# Patient Record
Sex: Female | Born: 1950 | Race: Black or African American | Hispanic: No | Smoking: Never smoker
Health system: Southern US, Community
[De-identification: ages and names within clinical notes are randomized; demographics above are authoritative.]

## PROBLEM LIST (undated history)

## (undated) DIAGNOSIS — M1990 Unspecified osteoarthritis, unspecified site: Secondary | ICD-10-CM

## (undated) DIAGNOSIS — R12 Heartburn: Secondary | ICD-10-CM

## (undated) DIAGNOSIS — K219 Gastro-esophageal reflux disease without esophagitis: Secondary | ICD-10-CM

## (undated) DIAGNOSIS — C55 Malignant neoplasm of uterus, part unspecified: Secondary | ICD-10-CM

## (undated) DIAGNOSIS — R918 Other nonspecific abnormal finding of lung field: Secondary | ICD-10-CM

## (undated) DIAGNOSIS — I10 Essential (primary) hypertension: Secondary | ICD-10-CM

## (undated) DIAGNOSIS — E785 Hyperlipidemia, unspecified: Secondary | ICD-10-CM

## (undated) DIAGNOSIS — E119 Type 2 diabetes mellitus without complications: Secondary | ICD-10-CM

## (undated) DIAGNOSIS — F419 Anxiety disorder, unspecified: Secondary | ICD-10-CM

## (undated) HISTORY — DX: Essential (primary) hypertension: I10

## (undated) HISTORY — PX: EYE SURGERY: SHX253

## (undated) HISTORY — DX: Heartburn: R12

## (undated) HISTORY — DX: Type 2 diabetes mellitus without complications: E11.9

## (undated) HISTORY — DX: Unspecified osteoarthritis, unspecified site: M19.90

## (undated) HISTORY — PX: ABDOMINAL HYSTERECTOMY: SUR658

## (undated) HISTORY — PX: ABDOMINAL HYSTERECTOMY: SHX81

## (undated) HISTORY — DX: Anxiety disorder, unspecified: F41.9

## (undated) HISTORY — DX: Malignant neoplasm of uterus, part unspecified: C55

## (undated) HISTORY — PX: DILATION AND CURETTAGE OF UTERUS: SHX78

## (undated) HISTORY — DX: Hyperlipidemia, unspecified: E78.5

---

## 2000-10-12 ENCOUNTER — Emergency Department (HOSPITAL_COMMUNITY): Admission: EM | Admit: 2000-10-12 | Discharge: 2000-10-13 | Payer: Self-pay | Admitting: Emergency Medicine

## 2006-05-16 ENCOUNTER — Emergency Department: Payer: Self-pay | Admitting: General Practice

## 2007-10-23 ENCOUNTER — Other Ambulatory Visit: Payer: Self-pay

## 2007-10-23 ENCOUNTER — Emergency Department: Payer: Self-pay | Admitting: Emergency Medicine

## 2009-08-04 ENCOUNTER — Emergency Department: Payer: Self-pay | Admitting: Emergency Medicine

## 2010-01-14 ENCOUNTER — Ambulatory Visit: Payer: Self-pay | Admitting: Family

## 2010-01-17 ENCOUNTER — Ambulatory Visit: Payer: Self-pay | Admitting: Family

## 2011-04-22 ENCOUNTER — Ambulatory Visit: Payer: Self-pay

## 2012-12-07 ENCOUNTER — Emergency Department: Payer: Self-pay | Admitting: Emergency Medicine

## 2012-12-07 LAB — URINALYSIS, COMPLETE
Bilirubin,UR: NEGATIVE
Glucose,UR: NEGATIVE mg/dL (ref 0–75)
Ketone: NEGATIVE
Nitrite: NEGATIVE
Ph: 6 (ref 4.5–8.0)
Protein: NEGATIVE
RBC,UR: 1 /HPF (ref 0–5)
Specific Gravity: 1.012 (ref 1.003–1.030)

## 2012-12-07 LAB — COMPREHENSIVE METABOLIC PANEL
Albumin: 3.7 g/dL (ref 3.4–5.0)
Calcium, Total: 8.7 mg/dL (ref 8.5–10.1)
Creatinine: 0.92 mg/dL (ref 0.60–1.30)
Glucose: 174 mg/dL — ABNORMAL HIGH (ref 65–99)
Osmolality: 281 (ref 275–301)
Potassium: 3.7 mmol/L (ref 3.5–5.1)
SGOT(AST): 21 U/L (ref 15–37)
Sodium: 138 mmol/L (ref 136–145)
Total Protein: 8 g/dL (ref 6.4–8.2)

## 2012-12-07 LAB — CBC WITH DIFFERENTIAL/PLATELET
Basophil %: 1.5 %
Eosinophil #: 0.2 10*3/uL (ref 0.0–0.7)
Eosinophil %: 2.9 %
HCT: 38.3 % (ref 35.0–47.0)
Lymphocyte #: 1.8 10*3/uL (ref 1.0–3.6)
MCH: 30.9 pg (ref 26.0–34.0)
Monocyte #: 0.3 x10 3/mm (ref 0.2–0.9)
Monocyte %: 5.7 %
Neutrophil #: 3 10*3/uL (ref 1.4–6.5)
Neutrophil %: 56.2 %
Platelet: 222 10*3/uL (ref 150–440)
RBC: 4.29 10*6/uL (ref 3.80–5.20)
RDW: 13.2 % (ref 11.5–14.5)

## 2012-12-07 LAB — TROPONIN I: Troponin-I: <0.02 ng/mL

## 2014-10-11 ENCOUNTER — Other Ambulatory Visit: Payer: Self-pay | Admitting: Family Medicine

## 2014-10-11 DIAGNOSIS — Z1231 Encounter for screening mammogram for malignant neoplasm of breast: Secondary | ICD-10-CM

## 2014-10-19 ENCOUNTER — Ambulatory Visit
Admission: RE | Admit: 2014-10-19 | Discharge: 2014-10-19 | Disposition: A | Discharge status: Home / Self Care | Payer: Medicare HMO | Source: Ambulatory Visit | Attending: Family Medicine | Admitting: Family Medicine

## 2014-10-19 ENCOUNTER — Other Ambulatory Visit: Payer: Self-pay | Admitting: Family Medicine

## 2014-10-19 DIAGNOSIS — Z1231 Encounter for screening mammogram for malignant neoplasm of breast: Secondary | ICD-10-CM | POA: Diagnosis present

## 2014-12-14 ENCOUNTER — Other Ambulatory Visit: Payer: Self-pay | Admitting: Family Medicine

## 2014-12-14 DIAGNOSIS — Z780 Asymptomatic menopausal state: Secondary | ICD-10-CM

## 2014-12-25 ENCOUNTER — Ambulatory Visit: Payer: Medicare HMO | Attending: Family Medicine

## 2016-05-18 ENCOUNTER — Other Ambulatory Visit
Admission: RE | Admit: 2016-05-18 | Discharge: 2016-05-18 | Disposition: A | Discharge status: Home / Self Care | Payer: Medicare HMO | Source: Other Acute Inpatient Hospital | Attending: Ophthalmology | Admitting: Ophthalmology

## 2016-05-18 DIAGNOSIS — H04302 Unspecified dacryocystitis of left lacrimal passage: Secondary | ICD-10-CM | POA: Diagnosis present

## 2016-05-22 LAB — EYE CULTURE

## 2016-08-26 ENCOUNTER — Other Ambulatory Visit: Payer: Self-pay | Admitting: Family Medicine

## 2016-08-26 DIAGNOSIS — Z1239 Encounter for other screening for malignant neoplasm of breast: Secondary | ICD-10-CM

## 2016-08-26 DIAGNOSIS — Z780 Asymptomatic menopausal state: Secondary | ICD-10-CM

## 2016-09-30 ENCOUNTER — Other Ambulatory Visit: Payer: Medicare HMO

## 2016-09-30 ENCOUNTER — Ambulatory Visit: Payer: Medicare HMO | Attending: Family Medicine

## 2016-10-19 ENCOUNTER — Encounter: Payer: Self-pay | Admitting: Urology

## 2016-10-19 ENCOUNTER — Ambulatory Visit (INDEPENDENT_AMBULATORY_CARE_PROVIDER_SITE_OTHER): Payer: Medicare Other | Admitting: Urology

## 2016-10-19 VITALS — BP 148/89 | HR 64 | Ht 66.0 in | Wt 169.7 lb

## 2016-10-19 DIAGNOSIS — N3010 Interstitial cystitis (chronic) without hematuria: Secondary | ICD-10-CM | POA: Diagnosis not present

## 2016-10-19 DIAGNOSIS — N3020 Other chronic cystitis without hematuria: Secondary | ICD-10-CM | POA: Diagnosis not present

## 2016-10-19 DIAGNOSIS — R109 Unspecified abdominal pain: Secondary | ICD-10-CM | POA: Diagnosis not present

## 2016-10-19 LAB — URINALYSIS, COMPLETE
Bilirubin, UA: NEGATIVE
GLUCOSE, UA: NEGATIVE
KETONES UA: NEGATIVE
Nitrite, UA: NEGATIVE
PROTEIN UA: NEGATIVE
RBC, UA: NEGATIVE
SPEC GRAV UA: 1.015 (ref 1.005–1.030)
Urobilinogen, Ur: 0.2 mg/dL (ref 0.2–1.0)
pH, UA: 6 (ref 5.0–7.5)

## 2016-10-19 LAB — MICROSCOPIC EXAMINATION: Bacteria, UA: NONE SEEN

## 2016-10-19 NOTE — Progress Notes (Signed)
10/19/2016 10:42 AM   Diane Graves 24-Aug-1950 [id number]  Referring provider: [facility], [address] [place], [place] [address]  Chief Complaint  Patient presents with  . New Patient (Initial Visit)    IC  referred by Princella Ion Dr. Clide Deutscher    HPI: I was consulted to assess the patient's suprapubic pain that is been present for a few months. Some of the details of the history were a little bit difficult to ascertain but it appears that it is an almost daily problem. It is not as severe as it was. She was also having some nonspecific left flank discomfort at one point in time. The pain would be relieved by voiding.  She can void every 30 minutes and sometimes every 2 hours. She gets up 3 or 4 times at night.  She just started on oral hypoglycemics. Sometimes she loses bowel control with loose bowel movements and now she is constipated. She has had a hysterectomy. She has been on antibiotics for various causes over the last year and only occasionally gets a bladder infection  Modifying factors: There are no other modifying factors  Associated signs and symptoms: There are no other associated signs and symptoms Aggravating and relieving factors: There are no other aggravating or relieving factors Severity: Moderate Duration: Persistent     PMH: Past Medical History:  Diagnosis Date  . Anxiety   . Arthritis   . Diabetes ([place])   . Heartburn   . HLD (hyperlipidemia)   . HTN (hypertension)   . Uterine cancer [facility])     Surgical History: Past Surgical History:  Procedure Laterality Date  . ABDOMINAL HYSTERECTOMY    . DILATION AND CURETTAGE OF UTERUS      Home Medications:  Allergies as of 10/19/2016      Reactions   Ciprofloxacin Shortness Of Breath      Medication List       Accurate as of 10/19/16 10:42 AM. Always use your most recent med list.          amLODipine-benazepril 5-40 MG capsule Commonly known as:   LOTREL Take by mouth.   aspirin 81 MG chewable tablet Chew by mouth.   Carboxymethylcellulose Sod PF 1 % Gel 1 drop Three (3) times a day in apheresis as needed.   cetirizine 10 MG tablet Commonly known as:  ZYRTEC Take 10 mg by mouth.   ciprofloxacin 0.3 % ophthalmic solution Commonly known as:  CILOXAN Administer 1 drop into the left eye Every six (6) hours. Administer 1 drop, every 2 hours, while awake, for 2 days. Then 1 drop, every 4 hours, while awake, for the next 5 days.   fluticasone 50 MCG/ACT nasal spray Commonly known as:  FLONASE 1 spray by Each Nare route daily as needed.   glucosamine-chondroitin 500-400 MG tablet Take 1 tablet by mouth 3 (three) times daily.   metFORMIN 500 MG 24 hr tablet Commonly known as:  GLUCOPHAGE-XR Take 500 mg by mouth daily with breakfast.   multivitamin capsule Take by mouth.   omeprazole 20 MG capsule Commonly known as:  PRILOSEC Take by mouth.   oxyCODONE-acetaminophen 5-325 MG tablet Commonly known as:  PERCOCET/ROXICET Take 1 tablet every 6 hours as needed for pain not responsive to Tylenol. Do not take if nauseated.   pravastatin 40 MG tablet Commonly known as:  PRAVACHOL Take by mouth.   ranitidine 150 MG capsule Commonly known as:  ZANTAC Take 150 mg by mouth.  SINUS RINSE KIT Pack Place into the nose.   Vitamin D3 2000 units capsule Take by mouth.       Allergies:  Allergies  Allergen Reactions  . Ciprofloxacin Shortness Of Breath    Family History: Family History  Problem Relation Age of Onset  . Breast cancer Sister 34  . Kidney cancer Neg Hx   . Bladder Cancer Neg Hx     Social History:  reports that she has never smoked. She has never used smokeless tobacco. She reports that she does not drink alcohol or use drugs.  ROS: UROLOGY Frequent Urination?: Yes Hard to postpone urination?: No Burning/pain with urination?: No Get up at night to urinate?: Yes Leakage of urine?: No Urine stream  starts and stops?: No Trouble starting stream?: No Do you have to strain to urinate?: No Blood in urine?: No Urinary tract infection?: Yes Sexually transmitted disease?: No Injury to kidneys or bladder?: No Painful intercourse?: Yes Weak stream?: No Currently pregnant?: No Vaginal bleeding?: No Last menstrual period?: n  Gastrointestinal Nausea?: No Vomiting?: No Indigestion/heartburn?: Yes Diarrhea?: Yes Constipation?: Yes  Constitutional Fever: No Night sweats?: No Weight loss?: No Fatigue?: Yes  Skin Skin rash/lesions?: No Itching?: No  Eyes Blurred vision?: Yes Double vision?: No  Ears/Nose/Throat Sore throat?: No Sinus problems?: No  Hematologic/Lymphatic Swollen glands?: No Easy bruising?: No  Cardiovascular Leg swelling?: No Chest pain?: No  Respiratory Cough?: No Shortness of breath?: No  Endocrine Excessive thirst?: No  Musculoskeletal Back pain?: Yes Joint pain?: Yes  Neurological Headaches?: Yes Dizziness?: No  Psychologic Depression?: No Anxiety?: No  Physical Exam: BP (!) 148/89   Pulse 64   Ht 5' 6"  (1.676 m)   Wt 169 lb 11.2 oz (77 kg)   BMI 27.39 kg/m   Constitutional:  Alert and oriented, No acute distress. HEENT: [place] AT, moist mucus membranes.  Trachea midline, no masses. Cardiovascular: No clubbing, cyanosis, or edema. Respiratory: Normal respiratory effort, no increased work of breathing. GI: Abdomen is soft, nontender, nondistended, no abdominal masses GU: Well supported bladder neck; no prolapse or bladder tenderness Skin: No rashes, bruises or suspicious lesions. Lymph: No cervical or inguinal adenopathy. Neurologic: Grossly intact, no focal deficits, moving all 4 extremities. Psychiatric: Normal mood and affect.  Laboratory Data: Lab Results  Component Value Date   WBC 5.3 12/07/2012   HGB 13.2 12/07/2012   HCT 38.3 12/07/2012   MCV 89 12/07/2012   PLT 222 12/07/2012    Lab Results  Component Value  Date   CREATININE 0.92 12/07/2012    No results found for: PSA  No results found for: TESTOSTERONE  No results found for: HGBA1C  Urinalysis    Component Value Date/Time   COLORURINE Straw 12/07/2012 1543   APPEARANCEUR Clear 12/07/2012 1543   LABSPEC 1.012 12/07/2012 1543   PHURINE 6.0 12/07/2012 1543   GLUCOSEU Negative 12/07/2012 1543   HGBUR Negative 12/07/2012 1543   BILIRUBINUR Negative 12/07/2012 1543   KETONESUR Negative 12/07/2012 1543   PROTEINUR Negative 12/07/2012 1543   NITRITE Negative 12/07/2012 1543   LEUKOCYTESUR 1+ 12/07/2012 1543    Pertinent Imaging: None  Assessment & Plan:  The patient has nonspecific suprapubic pain and intermittent left flank discomfort. The urine did not look infected today but I sent it for culture. There is a question that she might have interstitial cystitis. Her frequency is varying and she has moderate nocturia. The role of a cystoscopy and urodynamics was discussed. She may need a hydrodistention in the  future. She had no blood in the urine today  The patient will call us in about a month when she has her insurance shorted out from [place]. She used to live in [place]. We will then order a CT scan of the abdomin and pelvis with and without contrast to assess left flank pain and suprapubic pain and she will return for cystoscopy. I did not discuss the potential of interstitial cystitis today. She did report the bladder irritants can make it worse  1. IC (interstitial cystitis) 2. Chronic cystitis 3. Abdominal pain 4. Urinary frequency - Urinalysis, Complete   No Follow-up on file.  Reece Packer, MD  [facility] [address] [place], [place] [address] [phone]

## 2016-10-21 LAB — CULTURE, URINE COMPREHENSIVE

## 2017-05-03 ENCOUNTER — Other Ambulatory Visit: Payer: Self-pay | Admitting: Internal Medicine

## 2020-02-13 ENCOUNTER — Emergency Department (HOSPITAL_COMMUNITY): Payer: Medicare Other

## 2020-02-13 ENCOUNTER — Other Ambulatory Visit: Payer: Self-pay

## 2020-02-13 ENCOUNTER — Encounter (HOSPITAL_COMMUNITY): Payer: Self-pay

## 2020-02-13 ENCOUNTER — Emergency Department (HOSPITAL_COMMUNITY)
Admission: EM | Admit: 2020-02-13 | Discharge: 2020-02-13 | Disposition: A | Discharge status: Home / Self Care | Payer: Medicare Other | Attending: Emergency Medicine | Admitting: Emergency Medicine

## 2020-02-13 DIAGNOSIS — Z7982 Long term (current) use of aspirin: Secondary | ICD-10-CM | POA: Diagnosis not present

## 2020-02-13 DIAGNOSIS — Z79899 Other long term (current) drug therapy: Secondary | ICD-10-CM | POA: Insufficient documentation

## 2020-02-13 DIAGNOSIS — Y93I9 Activity, other involving external motion: Secondary | ICD-10-CM | POA: Diagnosis not present

## 2020-02-13 DIAGNOSIS — R519 Headache, unspecified: Secondary | ICD-10-CM | POA: Insufficient documentation

## 2020-02-13 DIAGNOSIS — Z7984 Long term (current) use of oral hypoglycemic drugs: Secondary | ICD-10-CM | POA: Insufficient documentation

## 2020-02-13 DIAGNOSIS — Z8541 Personal history of malignant neoplasm of cervix uteri: Secondary | ICD-10-CM | POA: Diagnosis not present

## 2020-02-13 DIAGNOSIS — I10 Essential (primary) hypertension: Secondary | ICD-10-CM | POA: Insufficient documentation

## 2020-02-13 DIAGNOSIS — E119 Type 2 diabetes mellitus without complications: Secondary | ICD-10-CM | POA: Insufficient documentation

## 2020-02-13 DIAGNOSIS — Y92410 Unspecified street and highway as the place of occurrence of the external cause: Secondary | ICD-10-CM | POA: Insufficient documentation

## 2020-02-13 DIAGNOSIS — M25512 Pain in left shoulder: Secondary | ICD-10-CM
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
## 2020-02-13 NOTE — Discharge Instructions (Addendum)
You can take Tylenol or Ibuprofen as directed for pain. You can alternate Tylenol and Ibuprofen every 4 hours. If you take Tylenol at 1pm, then you can take Ibuprofen at 5pm. Then you can take Tylenol again at 9pm.   Follow-up with your primary care doctor.  Return emergency department for any worsening pain, chest pain, difficulty breathing, abdominal pain, vomiting, difficulty walking or any other worsening concerning symptoms.
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
## 2021-07-22 ENCOUNTER — Other Ambulatory Visit (HOSPITAL_COMMUNITY): Payer: Self-pay | Admitting: Internal Medicine

## 2021-07-22 DIAGNOSIS — Z1231 Encounter for screening mammogram for malignant neoplasm of breast: Secondary | ICD-10-CM

## 2021-08-04 ENCOUNTER — Ambulatory Visit (HOSPITAL_COMMUNITY)
Admission: RE | Admit: 2021-08-04 | Discharge: 2021-08-04 | Disposition: A | Discharge status: Home / Self Care | Payer: Medicare Other | Source: Ambulatory Visit | Attending: Internal Medicine | Admitting: Internal Medicine

## 2021-08-04 ENCOUNTER — Other Ambulatory Visit: Payer: Self-pay

## 2021-08-04 DIAGNOSIS — Z1231 Encounter for screening mammogram for malignant neoplasm of breast: Secondary | ICD-10-CM | POA: Diagnosis not present

## 2021-09-21 DIAGNOSIS — I10 Essential (primary) hypertension: Secondary | ICD-10-CM | POA: Diagnosis not present

## 2021-09-21 DIAGNOSIS — E1169 Type 2 diabetes mellitus with other specified complication: Secondary | ICD-10-CM | POA: Diagnosis not present

## 2021-10-22 DIAGNOSIS — E1169 Type 2 diabetes mellitus with other specified complication: Secondary | ICD-10-CM | POA: Diagnosis not present

## 2021-10-22 DIAGNOSIS — I10 Essential (primary) hypertension: Secondary | ICD-10-CM | POA: Diagnosis not present

## 2021-11-21 DIAGNOSIS — E1169 Type 2 diabetes mellitus with other specified complication: Secondary | ICD-10-CM | POA: Diagnosis not present

## 2021-11-21 DIAGNOSIS — I10 Essential (primary) hypertension: Secondary | ICD-10-CM | POA: Diagnosis not present

## 2021-12-25 DIAGNOSIS — E1165 Type 2 diabetes mellitus with hyperglycemia: Secondary | ICD-10-CM | POA: Diagnosis not present

## 2021-12-25 DIAGNOSIS — I10 Essential (primary) hypertension: Secondary | ICD-10-CM | POA: Diagnosis not present

## 2022-01-19 DIAGNOSIS — I10 Essential (primary) hypertension: Secondary | ICD-10-CM | POA: Diagnosis not present

## 2022-01-19 DIAGNOSIS — K219 Gastro-esophageal reflux disease without esophagitis: Secondary | ICD-10-CM | POA: Diagnosis not present

## 2022-01-19 DIAGNOSIS — E1169 Type 2 diabetes mellitus with other specified complication: Secondary | ICD-10-CM | POA: Diagnosis not present

## 2022-01-19 DIAGNOSIS — Z23 Encounter for immunization: Secondary | ICD-10-CM | POA: Diagnosis not present

## 2022-02-18 DIAGNOSIS — I10 Essential (primary) hypertension: Secondary | ICD-10-CM | POA: Diagnosis not present

## 2022-02-18 DIAGNOSIS — E1169 Type 2 diabetes mellitus with other specified complication: Secondary | ICD-10-CM | POA: Diagnosis not present

## 2022-03-21 DIAGNOSIS — I10 Essential (primary) hypertension: Secondary | ICD-10-CM | POA: Diagnosis not present

## 2022-03-21 DIAGNOSIS — E1169 Type 2 diabetes mellitus with other specified complication: Secondary | ICD-10-CM | POA: Diagnosis not present

## 2022-03-26 DIAGNOSIS — I10 Essential (primary) hypertension: Secondary | ICD-10-CM | POA: Diagnosis not present

## 2022-03-26 DIAGNOSIS — E119 Type 2 diabetes mellitus without complications: Secondary | ICD-10-CM | POA: Diagnosis not present

## 2022-03-26 DIAGNOSIS — N390 Urinary tract infection, site not specified: Secondary | ICD-10-CM | POA: Diagnosis not present

## 2022-04-26 DIAGNOSIS — I10 Essential (primary) hypertension: Secondary | ICD-10-CM | POA: Diagnosis not present

## 2022-04-26 DIAGNOSIS — E119 Type 2 diabetes mellitus without complications: Secondary | ICD-10-CM | POA: Diagnosis not present

## 2022-05-27 DIAGNOSIS — E119 Type 2 diabetes mellitus without complications: Secondary | ICD-10-CM | POA: Diagnosis not present

## 2022-05-27 DIAGNOSIS — I10 Essential (primary) hypertension: Secondary | ICD-10-CM | POA: Diagnosis not present

## 2022-08-31 NOTE — H&P (Signed)
Surgical History & Physical  Patient Name: Diane Graves  DOB: 07/22/1950  Surgery: Cataract extraction with intraocular lens implant phacoemulsification; Right Eye Surgeon: Pecolia Ades MD Surgery Date: 09/18/2022 Pre-Op Date: 08/11/2022  HPI: A 17 Yr. old female patient 1. The patient complains of difficulty when viewing TV, glare problems when driving during the day, difficulties reading street signs, which began a few months ago. Both eyes are affected. The episode is constant. The condition's severity is worsening. This is negatively affecting the patient's quality of life and the patient is unable to function adequately in life with the current level of vision. Patient states she stopped Timolol and started a new drop that she uses at night OU (unsure name).  Medical History: Dry Eyes Glaucoma Cataracts Diabetes High Blood Pressure  Review of Systems Cardiovascular High Blood Pressure Endocrine diabetic All recorded systems are negative except as noted above.  Social Never smoked   Medication Metformin, Amlodipine besylate, Lotensin, Allergy pills, Pravastatin  Sx/Procedures Partial Hysterectomy  Drug Allergies  Sulfa, Amitriptyline  History & Physical: Heent: cataracts  NECK: supple without bruits LUNGS: lungs clear to auscultation CV: regular rate and rhythm Abdomen: soft and non-tender  Impression & Plan: Assessment: 1.  CATARACT AGE-RELATED NUCLEAR; Both Eyes (H25.13) 2.  CONJUNCTIVITIS ALLERGIC CHRONIC OTHER ; Both Eyes (H10.45) 3.  GLAUCOMA SUSPECT; Both Eyes (H40.003) 4.  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  Hyperopia ; Both Eyes (H52.03)  Plan: 1.  Cataracts are visually significant, and narrowing the anatomic angle, and account for the patient's complaints. Discussed all risks, benefits, procedures and recovery, including infection, loss of vision and eye, need for glasses after surgery or additional procedures. Patient  understands changing glasses will not improve vision. Patient indicated understanding of procedure. All questions answered. Patient desires to have surgery, recommend phacoemulsification with intraocular lens. Patient to have preliminary testing necessary (Argos/IOL Master, Mac OCT, TOPO) Educational materials provided:Cataract. RECOMMEND STANDARD VS EYEHANCE VS TORIC VS VIVTY  Plan: - Proceed with cataract surgery OD, followed by OS - Target best distance, RayOne lens - Narrow angles - ok with lying flat, no DM, no fuchs  2.  Discussed allergy treatment in detail. Block allergic response: Topical antihistamine for allergic conjunctivitis, oral antihistamine.  Improved with Pataday. Will continue this for tearing/itching  3.  Possible CACG with narrow angles. Will complete work up at next visit. IOP good today on timolol qam. OCT RNFL shows thinning OS>OD Pachy thin OU, 493/493 Gonio narrow with one quadrant to PTM. HVF looks great today.  Proceed with cataract extraction as above  4.  S/p nasolacrimal opening procedure. Unsure if this was DCR? Performed at [facility] May consider referral for repeat surgery in the future  5.  Hold Mrx for now

## 2022-09-11 ENCOUNTER — Encounter (HOSPITAL_COMMUNITY)
Admission: RE | Admit: 2022-09-11 | Discharge: 2022-09-11 | Disposition: A | Discharge status: Home / Self Care | Payer: Medicare Other | Source: Ambulatory Visit | Attending: Optometry | Admitting: Optometry

## 2022-09-11 ENCOUNTER — Encounter (HOSPITAL_COMMUNITY): Payer: Self-pay

## 2022-09-11 HISTORY — DX: Other nonspecific abnormal finding of lung field: R91.8

## 2022-09-11 HISTORY — DX: Gastro-esophageal reflux disease without esophagitis: K21.9

## 2022-09-18 ENCOUNTER — Encounter (HOSPITAL_COMMUNITY): Payer: Self-pay | Admitting: Optometry

## 2022-09-18 ENCOUNTER — Ambulatory Visit (HOSPITAL_COMMUNITY)
Admission: RE | Admit: 2022-09-18 | Discharge: 2022-09-18 | Disposition: A | Discharge status: Home / Self Care | Payer: Medicare Other | Source: Ambulatory Visit | Attending: Optometry | Admitting: Optometry

## 2022-09-18 ENCOUNTER — Encounter (HOSPITAL_COMMUNITY)
Admission: RE | Disposition: A | Discharge status: Home / Self Care | Payer: Self-pay | Source: Ambulatory Visit | Attending: Optometry

## 2022-09-18 ENCOUNTER — Ambulatory Visit (HOSPITAL_COMMUNITY): Payer: Medicare Other | Admitting: Anesthesiology

## 2022-09-18 ENCOUNTER — Ambulatory Visit (HOSPITAL_BASED_OUTPATIENT_CLINIC_OR_DEPARTMENT_OTHER): Payer: Medicare Other | Admitting: Anesthesiology

## 2022-09-18 DIAGNOSIS — K219 Gastro-esophageal reflux disease without esophagitis: Secondary | ICD-10-CM | POA: Diagnosis not present

## 2022-09-18 DIAGNOSIS — I10 Essential (primary) hypertension: Secondary | ICD-10-CM | POA: Diagnosis not present

## 2022-09-18 DIAGNOSIS — F419 Anxiety disorder, unspecified: Secondary | ICD-10-CM | POA: Insufficient documentation

## 2022-09-18 DIAGNOSIS — E1136 Type 2 diabetes mellitus with diabetic cataract: Secondary | ICD-10-CM | POA: Diagnosis not present

## 2022-09-18 DIAGNOSIS — H2511 Age-related nuclear cataract, right eye: Secondary | ICD-10-CM | POA: Diagnosis not present

## 2022-09-18 DIAGNOSIS — E119 Type 2 diabetes mellitus without complications: Secondary | ICD-10-CM

## 2022-09-18 HISTORY — PX: CATARACT EXTRACTION W/PHACO: SHX586

## 2022-09-18 LAB — GLUCOSE, CAPILLARY: Glucose-Capillary: 113 mg/dL — ABNORMAL HIGH (ref 70–99)

## 2022-09-18 SURGERY — PHACOEMULSIFICATION, CATARACT, WITH IOL INSERTION
Anesthesia: Monitor Anesthesia Care | Site: Eye | Laterality: Right

## 2022-09-18 MED ORDER — TROPICAMIDE 1 % OP SOLN
1.0000 [drp] | OPHTHALMIC | Status: AC
  Administered 2022-09-18 (×3): 1 [drp] via OPHTHALMIC

## 2022-09-18 MED ORDER — MIDAZOLAM HCL 2 MG/2ML IJ SOLN
INTRAMUSCULAR | Status: AC
  Filled 2022-09-18: qty 2

## 2022-09-18 MED ORDER — SIGHTPATH DOSE#1 NA HYALUR & NA CHOND-NA HYALUR IO KIT
PACK | INTRAOCULAR | Status: DC | PRN
  Administered 2022-09-18: 1 via OPHTHALMIC

## 2022-09-18 MED ORDER — POVIDONE-IODINE 5 % OP SOLN
OPHTHALMIC | Status: DC | PRN
  Administered 2022-09-18: 1 via OPHTHALMIC

## 2022-09-18 MED ORDER — LIDOCAINE HCL 3.5 % OP GEL
1.0000 | Freq: Once | OPHTHALMIC | Status: AC
  Administered 2022-09-18: 1 via OPHTHALMIC

## 2022-09-18 MED ORDER — TETRACAINE HCL 0.5 % OP SOLN
1.0000 [drp] | OPHTHALMIC | Status: AC
  Administered 2022-09-18 (×3): 1 [drp] via OPHTHALMIC

## 2022-09-18 MED ORDER — STERILE WATER FOR IRRIGATION IR SOLN
Status: DC | PRN
  Administered 2022-09-18: 250 mL

## 2022-09-18 MED ORDER — MIDAZOLAM HCL 2 MG/2ML IJ SOLN
INTRAMUSCULAR | Status: DC | PRN
  Administered 2022-09-18: 1 mg via INTRAVENOUS

## 2022-09-18 MED ORDER — NEOMYCIN-POLYMYXIN-DEXAMETH 3.5-10000-0.1 OP SUSP
OPHTHALMIC | Status: DC | PRN
  Administered 2022-09-18: 2 [drp] via OPHTHALMIC

## 2022-09-18 MED ORDER — PHENYLEPHRINE-KETOROLAC 1-0.3 % IO SOLN
INTRAOCULAR | Status: DC | PRN
  Administered 2022-09-18: 500 mL via OPHTHALMIC

## 2022-09-18 MED ORDER — LIDOCAINE HCL (PF) 1 % IJ SOLN
INTRAMUSCULAR | Status: DC | PRN
  Administered 2022-09-18: 2 mL

## 2022-09-18 MED ORDER — FENTANYL CITRATE (PF) 100 MCG/2ML IJ SOLN
INTRAMUSCULAR | Status: DC | PRN
  Administered 2022-09-18: 50 ug via INTRAVENOUS

## 2022-09-18 MED ORDER — PHENYLEPHRINE HCL 2.5 % OP SOLN
1.0000 [drp] | OPHTHALMIC | Status: AC
  Administered 2022-09-18 (×3): 1 [drp] via OPHTHALMIC

## 2022-09-18 MED ORDER — BSS IO SOLN
INTRAOCULAR | Status: DC | PRN
  Administered 2022-09-18: 15 mL via INTRAOCULAR

## 2022-09-18 MED ORDER — FENTANYL CITRATE (PF) 100 MCG/2ML IJ SOLN
INTRAMUSCULAR | Status: AC
  Filled 2022-09-18: qty 2

## 2022-09-18 SURGICAL SUPPLY — 15 items
CATARACT SUITE SIGHTPATH (MISCELLANEOUS) ×1 IMPLANT
CLOTH BEACON ORANGE TIMEOUT ST (SAFETY) ×2 IMPLANT
DRSG TEGADERM 4X4.75 (GAUZE/BANDAGES/DRESSINGS) ×2 IMPLANT
EYE SHIELD UNIVERSAL CLEAR (GAUZE/BANDAGES/DRESSINGS) IMPLANT
FEE CATARACT SUITE SIGHTPATH (MISCELLANEOUS) ×2 IMPLANT
GLOVE BIOGEL PI IND STRL 7.0 (GLOVE) ×4 IMPLANT
LENS IOL RAYNER 25.0 (Intraocular Lens) ×1 IMPLANT
LENS IOL RAYONE EMV 25.0 (Intraocular Lens) IMPLANT
NDL HYPO 18GX1.5 BLUNT FILL (NEEDLE) ×2 IMPLANT
NEEDLE HYPO 18GX1.5 BLUNT FILL (NEEDLE) ×1 IMPLANT
PAD ARMBOARD 7.5X6 YLW CONV (MISCELLANEOUS) ×2 IMPLANT
RING MALYGIN 7.0 (MISCELLANEOUS) IMPLANT
SYR TB 1ML LL NO SAFETY (SYRINGE) ×2 IMPLANT
TAPE SURG TRANSPORE 1 IN (GAUZE/BANDAGES/DRESSINGS) IMPLANT
WATER STERILE IRR 250ML POUR (IV SOLUTION) ×2 IMPLANT

## 2022-09-18 NOTE — Op Note (Signed)
Date of procedure: 09/18/22  Pre-operative diagnosis: Visually significant age-related nuclear cataract, Right Eye (H25.11)  Post-operative diagnosis: Visually significant age-related nuclear cataract, Right Eye  Procedure: Removal of cataract via phacoemulsification and insertion of intra-ocular lens Rayner [id number] +25.0D into the capsular bag of the Right Eye  Attending surgeon: Pecolia Ades, MD  Anesthesia: MAC, Topical Akten  Complications: None  Estimated Blood Loss: <54mL (minimal)  Specimens: None  Implants:  Implant Name Type Inv. Item Serial No. Manufacturer Lot No. LRB No. Used Action  LENS IOL RAYNER 25.0 - S37 Intraocular Lens LENS IOL RAYNER 25.0 37 SIGHTPATH [id number] Right 1 Implanted    Indications:  Visually significant age-related cataract, Right Eye  Procedure:  The patient was seen and identified in the pre-operative area. The operative eye was identified and dilated.  The operative eye was marked.  Topical anesthesia was administered to the operative eye.     The patient was then to the operative suite and placed in the supine position.  A timeout was performed confirming the patient, procedure to be performed, and all other relevant information.   The patient's face was prepped and draped in the usual fashion for intra-ocular surgery.  A lid speculum was placed into the operative eye and the surgical microscope moved into place and focused.  A superotemporal paracentesis was created using a 20 gauge paracentesis blade.  BSS mixed with Omidria, followed by 1% lidocaine was injected into the anterior chamber.  Viscoelastic was injected into the anterior chamber.  A temporal clear-corneal main wound incision was created using a 2.84mm microkeratome.  A continuous curvilinear capsulorrhexis was initiated using an irrigating cystitome and completed using capsulorrhexis forceps.  Hydrodissection and hydrodeliniation were performed.  Viscoelastic was injected into the  anterior chamber.  A phacoemulsification handpiece and a chopper as a second instrument were used to remove the nucleus and epinucleus. The irrigation/aspiration handpiece was used to remove any remaining cortical material.   The capsular bag was reinflated with viscoelastic, checked, and found to be intact.  The intraocular lens was inserted into the capsular bag.  The irrigation/aspiration handpiece was used to remove any remaining viscoelastic.  The clear corneal wound and paracentesis wounds were then hydrated and checked with Weck-Cels to be watertight.  The lid-speculum and drape was removed, and the patient's face was cleaned with a wet and dry 4x4.  Maxitrol drops were instilled onto the eye. A clear shield was taped over the eye. The patient was taken to the post-operative care unit in good condition, having tolerated the procedure well.  Post-Op Instructions: The patient will follow up at [facility] for a same day post-operative evaluation and will receive all other orders and instructions.

## 2022-09-18 NOTE — Interval H&P Note (Signed)
History and Physical Interval Note:  09/18/2022 7:58 AM  The H and P was reviewed and updated. The patient was examined.  No changes were found after exam.  The surgical eye was marked.  Diane Graves

## 2022-09-18 NOTE — Discharge Instructions (Signed)
Please discharge patient when stable, will follow up today with Dr. Obrian Bulson at the [facility] office immediately following discharge.  Leave shield in place until visit.  All paperwork with discharge instructions will be given at the office.  [facility] Address:  [address]  [place], [place] [address]  Dr. Janeliz Prestwood's Phone: [phone]  

## 2022-09-18 NOTE — Transfer of Care (Signed)
Immediate Anesthesia Transfer of Care Note  Patient: Diane Graves  Procedure(s) Performed: CATARACT EXTRACTION PHACO AND INTRAOCULAR LENS PLACEMENT (IOC) (Right: Eye)  Patient Location: PACU and Short Stay  Anesthesia Type:MAC  Level of Consciousness: awake and alert   Airway & Oxygen Therapy: Patient Spontanous Breathing and Patient connected to nasal cannula oxygen  Post-op Assessment: Report given to RN and Post -op Vital signs reviewed and stable  Post vital signs: Reviewed and stable  Last Vitals:  Vitals Value Taken Time  BP    Temp    Pulse    Resp    SpO2      Last Pain:  Vitals:   09/18/22 0737  TempSrc: Oral  PainSc: 0-No pain         Complications: No notable events documented.

## 2022-09-18 NOTE — Anesthesia Preprocedure Evaluation (Signed)
Anesthesia Evaluation  Patient identified by MRN, date of birth, ID band Patient awake    Reviewed: Allergy & Precautions, H&P , NPO status , Patient's Chart, lab work & pertinent test results, reviewed documented beta blocker date and time   Airway Mallampati: II  TM Distance: >3 FB Neck ROM: full    Dental no notable dental hx.    Pulmonary neg pulmonary ROS   Pulmonary exam normal breath sounds clear to auscultation       Cardiovascular Exercise Tolerance: Good hypertension, negative cardio ROS  Rhythm:regular Rate:Normal     Neuro/Psych   Anxiety     negative neurological ROS  negative psych ROS   GI/Hepatic negative GI ROS, Neg liver ROS,GERD  ,,  Endo/Other  negative endocrine ROSdiabetes    Renal/GU negative Renal ROS  negative genitourinary   Musculoskeletal   Abdominal   Peds  Hematology negative hematology ROS (+)   Anesthesia Other Findings   Reproductive/Obstetrics negative OB ROS                             Anesthesia Physical Anesthesia Plan  ASA: 2  Anesthesia Plan: MAC   Post-op Pain Management:    Induction:   PONV Risk Score and Plan: Propofol infusion  Airway Management Planned:   Additional Equipment:   Intra-op Plan:   Post-operative Plan:   Informed Consent: I have reviewed the patients History and Physical, chart, labs and discussed the procedure including the risks, benefits and alternatives for the proposed anesthesia with the patient or authorized representative who has indicated his/her understanding and acceptance.     Dental Advisory Given  Plan Discussed with: CRNA  Anesthesia Plan Comments:        Anesthesia Quick Evaluation

## 2022-09-19 NOTE — Anesthesia Postprocedure Evaluation (Signed)
Anesthesia Post Note  Patient: MATIE MERCURE  Procedure(s) Performed: CATARACT EXTRACTION PHACO AND INTRAOCULAR LENS PLACEMENT (IOC) (Right: Eye)  Patient location during evaluation: Phase II Anesthesia Type: MAC Level of consciousness: awake Pain management: pain level controlled Vital Signs Assessment: post-procedure vital signs reviewed and stable Respiratory status: spontaneous breathing and respiratory function stable Cardiovascular status: blood pressure returned to baseline and stable Postop Assessment: no headache and no apparent nausea or vomiting Anesthetic complications: no Comments: Late entry   No notable events documented.   Last Vitals:  Vitals:   09/18/22 0745 09/18/22 0851  BP: 131/68 132/76  Pulse:  61  Resp:  15  Temp:  36.7 C  SpO2:  96%    Last Pain:  Vitals:   09/18/22 0851  TempSrc: Oral  PainSc: 0-No pain                 Windell Norfolk

## 2022-09-23 NOTE — H&P (Signed)
Surgical History & Physical  Patient Name: Diane Graves  DOB: 10-15-1950  Surgery: Cataract extraction with intraocular lens implant phacoemulsification; Left Eye Surgeon: Pecolia Ades MD Surgery Date:10/02/2022 Pre-Op Date: 09/22/2022  HPI: A 7 Yr. old female patient present for 4 day post op OD. Patient is doing well. Prednisolone and Ciprofloxacin QID, Ketorolac QID.  Difficulties with glare during the day and at night when driving. Hazy vision OS. Poor night vision. This is negatively affecting the patient's quality of life and the patient is unable to function adequately in life with the current level of vision. Patient would like to proceed with cataract sx OS.  Medical History: Dry Eyes Glaucoma Cataracts Diabetes High Blood Pressure  Review of Systems Cardiovascular High Blood Pressure Endocrine diabetic All recorded systems are negative except as noted above.  Social Never smoked   Medication Ciprofloxacin, Ketorolac, Prednisolone acetate 1%,  Metformin, Amlodipine besylate, Lotensin, Allergy pills, Pravastatin  Sx/Procedures Phaco c IOL OD,  Partial Hysterectomy  Drug Allergies  Sulfa, Amitriptyline   History & Physical: Heent: cataract OS, PCL OD NECK: supple without bruits LUNGS: lungs clear to auscultation CV: regular rate and rhythm Abdomen: soft and non-tender  Impression & Plan: Assessment: 1.  CATARACT EXTRACTION STATUS; Right Eye (Z98.41) 2.  INTRAOCULAR LENS IOL ; Right Eye (Z96.1) 3.  CATARACT AGE-RELATED NUCLEAR; , Left Eye (H25.12)  Plan: 1.  POD4 exam. Doing well. All post-op precautions discussed and instructions reviewed. Written instructions given.  2.  See above  3.  Cataracts are visually significant, and narrowing the anatomic angle, and account for the patient's complaints. Discussed all risks, benefits, procedures and recovery, including infection, loss of vision and eye, need for glasses after surgery or additional  procedures. Patient understands changing glasses will not improve vision. Patient indicated understanding of procedure. All questions answered. Patient desires to have surgery, recommend phacoemulsification with intraocular lens. Patient to have preliminary testing necessary (Argos/IOL Master, Mac OCT, TOPO) Educational materials provided:Cataract. RECOMMEND STANDARD VS EYEHANCE VS TORIC VS VIVTY  Plan: - Proceed with cataract surgery OS - Target best distance, RayOne lens - Narrow angles - ok with lying flat, no DM, no fuchs

## 2022-09-24 ENCOUNTER — Encounter (HOSPITAL_COMMUNITY): Payer: Self-pay | Admitting: Optometry

## 2022-09-28 ENCOUNTER — Encounter (HOSPITAL_COMMUNITY)
Admission: RE | Admit: 2022-09-28 | Discharge: 2022-09-28 | Disposition: A | Discharge status: Home / Self Care | Payer: Medicare Other | Source: Ambulatory Visit | Attending: Optometry | Admitting: Optometry

## 2022-09-28 ENCOUNTER — Encounter (HOSPITAL_COMMUNITY): Payer: Self-pay

## 2022-10-02 ENCOUNTER — Other Ambulatory Visit: Payer: Self-pay

## 2022-10-02 ENCOUNTER — Ambulatory Visit (HOSPITAL_BASED_OUTPATIENT_CLINIC_OR_DEPARTMENT_OTHER): Payer: Medicare Other | Admitting: Anesthesiology

## 2022-10-02 ENCOUNTER — Ambulatory Visit (HOSPITAL_COMMUNITY): Payer: Medicare Other | Admitting: Anesthesiology

## 2022-10-02 ENCOUNTER — Ambulatory Visit (HOSPITAL_COMMUNITY)
Admission: RE | Admit: 2022-10-02 | Discharge: 2022-10-02 | Disposition: A | Discharge status: Home / Self Care | Payer: Medicare Other | Source: Ambulatory Visit | Attending: Optometry | Admitting: Optometry

## 2022-10-02 ENCOUNTER — Encounter (HOSPITAL_COMMUNITY): Payer: Self-pay | Admitting: Optometry

## 2022-10-02 ENCOUNTER — Encounter (HOSPITAL_COMMUNITY)
Admission: RE | Disposition: A | Discharge status: Home / Self Care | Payer: Self-pay | Source: Ambulatory Visit | Attending: Optometry

## 2022-10-02 DIAGNOSIS — F419 Anxiety disorder, unspecified: Secondary | ICD-10-CM | POA: Diagnosis not present

## 2022-10-02 DIAGNOSIS — K219 Gastro-esophageal reflux disease without esophagitis: Secondary | ICD-10-CM | POA: Diagnosis not present

## 2022-10-02 DIAGNOSIS — I10 Essential (primary) hypertension: Secondary | ICD-10-CM | POA: Diagnosis not present

## 2022-10-02 DIAGNOSIS — E1136 Type 2 diabetes mellitus with diabetic cataract: Secondary | ICD-10-CM | POA: Diagnosis present

## 2022-10-02 DIAGNOSIS — Z7984 Long term (current) use of oral hypoglycemic drugs: Secondary | ICD-10-CM | POA: Diagnosis not present

## 2022-10-02 DIAGNOSIS — Z79899 Other long term (current) drug therapy: Secondary | ICD-10-CM | POA: Diagnosis not present

## 2022-10-02 DIAGNOSIS — H2512 Age-related nuclear cataract, left eye: Secondary | ICD-10-CM | POA: Insufficient documentation

## 2022-10-02 DIAGNOSIS — E119 Type 2 diabetes mellitus without complications: Secondary | ICD-10-CM

## 2022-10-02 HISTORY — PX: CATARACT EXTRACTION W/PHACO: SHX586

## 2022-10-02 LAB — GLUCOSE, CAPILLARY: Glucose-Capillary: 131 mg/dL — ABNORMAL HIGH (ref 70–99)

## 2022-10-02 SURGERY — PHACOEMULSIFICATION, CATARACT, WITH IOL INSERTION
Anesthesia: Monitor Anesthesia Care | Site: Eye | Laterality: Left

## 2022-10-02 MED ORDER — FENTANYL CITRATE (PF) 100 MCG/2ML IJ SOLN
INTRAMUSCULAR | Status: DC | PRN
  Administered 2022-10-02: 50 ug via INTRAVENOUS

## 2022-10-02 MED ORDER — LIDOCAINE HCL 3.5 % OP GEL
1.0000 | Freq: Once | OPHTHALMIC | Status: AC
  Administered 2022-10-02: 1 via OPHTHALMIC

## 2022-10-02 MED ORDER — NEOMYCIN-POLYMYXIN-DEXAMETH 3.5-10000-0.1 OP SUSP
OPHTHALMIC | Status: DC | PRN
  Administered 2022-10-02: 2 [drp] via OPHTHALMIC

## 2022-10-02 MED ORDER — MIDAZOLAM HCL 2 MG/2ML IJ SOLN
INTRAMUSCULAR | Status: DC | PRN
  Administered 2022-10-02: 1 mg via INTRAVENOUS

## 2022-10-02 MED ORDER — POVIDONE-IODINE 5 % OP SOLN
OPHTHALMIC | Status: DC | PRN
  Administered 2022-10-02: 1 via OPHTHALMIC

## 2022-10-02 MED ORDER — FENTANYL CITRATE (PF) 100 MCG/2ML IJ SOLN
INTRAMUSCULAR | Status: AC
  Filled 2022-10-02: qty 2

## 2022-10-02 MED ORDER — STERILE WATER FOR IRRIGATION IR SOLN
Status: DC | PRN
  Administered 2022-10-02: 250 mL

## 2022-10-02 MED ORDER — TROPICAMIDE 1 % OP SOLN
1.0000 [drp] | OPHTHALMIC | Status: AC | PRN
  Administered 2022-10-02 (×3): 1 [drp] via OPHTHALMIC

## 2022-10-02 MED ORDER — PHENYLEPHRINE-KETOROLAC 1-0.3 % IO SOLN
INTRAOCULAR | Status: DC | PRN
  Administered 2022-10-02: 500 mL via OPHTHALMIC

## 2022-10-02 MED ORDER — SODIUM CHLORIDE 0.9% FLUSH
INTRAVENOUS | Status: DC | PRN
  Administered 2022-10-02: 5 mL via INTRAVENOUS

## 2022-10-02 MED ORDER — TETRACAINE HCL 0.5 % OP SOLN
1.0000 [drp] | OPHTHALMIC | Status: AC | PRN
  Administered 2022-10-02 (×3): 1 [drp] via OPHTHALMIC

## 2022-10-02 MED ORDER — SIGHTPATH DOSE#1 NA HYALUR & NA CHOND-NA HYALUR IO KIT
PACK | INTRAOCULAR | Status: DC | PRN
  Administered 2022-10-02: 1 via OPHTHALMIC

## 2022-10-02 MED ORDER — BSS IO SOLN
INTRAOCULAR | Status: DC | PRN
  Administered 2022-10-02: 15 mL via INTRAOCULAR

## 2022-10-02 MED ORDER — LIDOCAINE HCL (PF) 1 % IJ SOLN
INTRAMUSCULAR | Status: DC | PRN
  Administered 2022-10-02: 2 mL

## 2022-10-02 MED ORDER — PHENYLEPHRINE HCL 2.5 % OP SOLN
1.0000 [drp] | OPHTHALMIC | Status: AC | PRN
  Administered 2022-10-02 (×3): 1 [drp] via OPHTHALMIC

## 2022-10-02 MED ORDER — MIDAZOLAM HCL 2 MG/2ML IJ SOLN
INTRAMUSCULAR | Status: AC
  Filled 2022-10-02: qty 2

## 2022-10-02 SURGICAL SUPPLY — 15 items
CATARACT SUITE SIGHTPATH (MISCELLANEOUS) ×1 IMPLANT
CLOTH BEACON ORANGE TIMEOUT ST (SAFETY) ×2 IMPLANT
DRSG TEGADERM 4X4.75 (GAUZE/BANDAGES/DRESSINGS) ×2 IMPLANT
EYE SHIELD UNIVERSAL CLEAR (GAUZE/BANDAGES/DRESSINGS) IMPLANT
FEE CATARACT SUITE SIGHTPATH (MISCELLANEOUS) ×2 IMPLANT
GLOVE BIOGEL PI IND STRL 7.0 (GLOVE) ×4 IMPLANT
LENS IOL RAYNER 24.5 (Intraocular Lens) ×1 IMPLANT
LENS IOL RAYONE EMV 24.5 (Intraocular Lens) IMPLANT
NDL HYPO 18GX1.5 BLUNT FILL (NEEDLE) ×2 IMPLANT
NEEDLE HYPO 18GX1.5 BLUNT FILL (NEEDLE) ×1 IMPLANT
PAD ARMBOARD 7.5X6 YLW CONV (MISCELLANEOUS) ×2 IMPLANT
RING MALYGIN 7.0 (MISCELLANEOUS) IMPLANT
SYR TB 1ML LL NO SAFETY (SYRINGE) ×2 IMPLANT
TAPE SURG TRANSPORE 1 IN (GAUZE/BANDAGES/DRESSINGS) IMPLANT
WATER STERILE IRR 250ML POUR (IV SOLUTION) ×2 IMPLANT

## 2022-10-02 NOTE — Anesthesia Preprocedure Evaluation (Signed)
Anesthesia Evaluation  Patient identified by MRN, date of birth, ID band Patient awake    Reviewed: Allergy & Precautions, H&P , NPO status , Patient's Chart, lab work & pertinent test results  Airway Mallampati: II  TM Distance: >3 FB Neck ROM: Full    Dental  (+) Missing, Dental Advisory Given   Pulmonary neg pulmonary ROS   Pulmonary exam normal breath sounds clear to auscultation       Cardiovascular Exercise Tolerance: Good hypertension, Pt. on medications Normal cardiovascular exam Rhythm:Regular Rate:Normal     Neuro/Psych  PSYCHIATRIC DISORDERS Anxiety     negative neurological ROS     GI/Hepatic Neg liver ROS,GERD  Medicated and Controlled,,  Endo/Other  diabetes, Well Controlled, Type 2, Oral Hypoglycemic Agents    Renal/GU negative Renal ROS  negative genitourinary   Musculoskeletal  (+) Arthritis , Osteoarthritis,    Abdominal   Peds negative pediatric ROS (+)  Hematology negative hematology ROS (+)   Anesthesia Other Findings   Reproductive/Obstetrics negative OB ROS                             Anesthesia Physical Anesthesia Plan  ASA: 2  Anesthesia Plan: MAC   Post-op Pain Management: Minimal or no pain anticipated   Induction:   PONV Risk Score and Plan: Treatment may vary due to age or medical condition  Airway Management Planned: Nasal Cannula and Natural Airway  Additional Equipment:   Intra-op Plan:   Post-operative Plan:   Informed Consent: I have reviewed the patients History and Physical, chart, labs and discussed the procedure including the risks, benefits and alternatives for the proposed anesthesia with the patient or authorized representative who has indicated his/her understanding and acceptance.       Plan Discussed with: CRNA and Surgeon  Anesthesia Plan Comments:        Anesthesia Quick Evaluation

## 2022-10-02 NOTE — Anesthesia Postprocedure Evaluation (Signed)
Anesthesia Post Note  Patient: Diane Graves  Procedure(s) Performed: CATARACT EXTRACTION PHACO AND INTRAOCULAR LENS PLACEMENT (IOC) (Left: Eye)  Patient location during evaluation: Phase II Anesthesia Type: MAC Level of consciousness: awake and alert and oriented Pain management: pain level controlled Vital Signs Assessment: post-procedure vital signs reviewed and stable Respiratory status: spontaneous breathing, nonlabored ventilation and respiratory function stable Cardiovascular status: stable and blood pressure returned to baseline Postop Assessment: no apparent nausea or vomiting Anesthetic complications: no  No notable events documented.   Last Vitals:  Vitals:   10/02/22 0905 10/02/22 1016  BP: (!) 150/84 (!) 142/88  Pulse: (!) 54 (!) 55  Resp: 18 14  Temp: 36.6 C 36.6 C  SpO2: 97% 97%    Last Pain:  Vitals:   10/02/22 1016  TempSrc: Oral  PainSc: 0-No pain                 Gage Weant C Avian [place]awalt

## 2022-10-02 NOTE — Discharge Instructions (Signed)
Please discharge patient when stable, will follow up today with Dr. Breslin Hemann at the [facility] office immediately following discharge.  Leave shield in place until visit.  All paperwork with discharge instructions will be given at the office.  [facility] Address:  [address]  [place], [place] [address]  Dr. Atreus Hasz's Phone: [phone]  

## 2022-10-02 NOTE — Interval H&P Note (Signed)
History and Physical Interval Note:  10/02/2022 9:12 AM  The H and P was reviewed and updated. The patient was examined.  No changes were found after exam.  The surgical eye was marked.  Diane Graves

## 2022-10-02 NOTE — Op Note (Signed)
Date of procedure: 10/02/22  Pre-operative diagnosis: Visually significant age-related nuclear cataract, Left Eye (H25.12)  Post-operative diagnosis: Visually significant age-related nuclear cataract, Left Eye  Procedure: Removal of cataract via phacoemulsification and insertion of intra-ocular lens Rayner [id number] +24.5D into the capsular bag of the Left Eye  Attending surgeon: Ronal Fear, MD  Anesthesia: MAC, Topical Akten  Complications: None  Estimated Blood Loss: <77mL (minimal)  Specimens: None  Implants:  Implant Name Type Inv. Item Serial No. Manufacturer Lot No. LRB No. Used Action  LENS IOL RAYNER 24.5 - [id number] Intraocular Lens LENS IOL RAYNER 24.5  [employer]IGHT[employer] [id number] Left 1 Implanted    Indications:  Visually significant age-related cataract, Left Eye  Procedure:  The patient was seen and identified in the pre-operative area. The operative eye was identified and dilated.  The operative eye was marked.  Topical anesthesia was administered to the operative eye.     The patient was then to the operative suite and placed in the supine position.  A timeout was performed confirming the patient, procedure to be performed, and all other relevant information.   The patient's face was prepped and draped in the usual fashion for intra-ocular surgery.  A lid speculum was placed into the operative eye and the surgical microscope moved into place and focused.  An inferotemporal paracentesis was created using a 20 gauge paracentesis blade.  BSS mixed with Omidria, followed by 1% lidocaine was injected into the anterior chamber.  Viscoelastic was injected into the anterior chamber.  A temporal clear-corneal main wound incision was created using a 2.58mm microkeratome.  A continuous curvilinear capsulorrhexis was initiated using an irrigating cystitome and completed using capsulorrhexis forceps.  Hydrodissection and hydrodeliniation were performed.  Viscoelastic was injected into the  anterior chamber.  A phacoemulsification handpiece and a chopper as a second instrument were used to remove the nucleus and epinucleus. The irrigation/aspiration handpiece was used to remove any remaining cortical material.   The capsular bag was reinflated with viscoelastic, checked, and found to be intact.  The intraocular lens was inserted into the capsular bag.  The irrigation/aspiration handpiece was used to remove any remaining viscoelastic.  The clear corneal wound and paracentesis wounds were then hydrated and checked with Weck-Cels to be watertight.  The lid-speculum and drape was removed, and the patient's face was cleaned with a wet and dry 4x4.  Maxitrol drops were instilled onto the eye. A clear shield was taped over the eye. The patient was taken to the post-operative care unit in good condition, having tolerated the procedure well.  Post-Op Instructions: The patient will follow up at [facility] for a same day post-operative evaluation and will receive all other orders and instructions.

## 2022-10-02 NOTE — Anesthesia Procedure Notes (Signed)
Procedure Name: MAC Date/Time: 10/02/2022 9:57 AM  Performed by: Julian Reil, CRNAPre-anesthesia Checklist: Patient identified, Emergency Drugs available, Suction available and Patient being monitored Patient Re-evaluated:Patient Re-evaluated prior to induction Oxygen Delivery Method: Nasal cannula Placement Confirmation: positive ETCO2

## 2022-10-02 NOTE — Transfer of Care (Signed)
Immediate Anesthesia Transfer of Care Note  Patient: Diane Graves  Procedure(s) Performed: CATARACT EXTRACTION PHACO AND INTRAOCULAR LENS PLACEMENT (IOC) (Left: Eye)  Patient Location: Short Stay  Anesthesia Type:MAC  Level of Consciousness: awake, alert , and oriented  Airway & Oxygen Therapy: Patient Spontanous Breathing  Post-op Assessment: Report given to RN and Post -op Vital signs reviewed and stable  Post vital signs: Reviewed and stable  Last Vitals:  Vitals Value Taken Time  BP    Temp    Pulse    Resp    SpO2      Last Pain:  Vitals:   10/02/22 0905  TempSrc: Oral  PainSc: 0-No pain         Complications: No notable events documented.

## 2022-10-09 ENCOUNTER — Encounter (HOSPITAL_COMMUNITY): Payer: Self-pay | Admitting: Optometry

## 2022-10-14 ENCOUNTER — Other Ambulatory Visit: Payer: Self-pay | Admitting: Family Medicine

## 2022-10-14 DIAGNOSIS — Z1231 Encounter for screening mammogram for malignant neoplasm of breast: Secondary | ICD-10-CM

## 2022-10-22 ENCOUNTER — Other Ambulatory Visit (HOSPITAL_COMMUNITY): Payer: Self-pay | Admitting: Family Medicine

## 2022-10-22 ENCOUNTER — Encounter (HOSPITAL_COMMUNITY): Payer: Self-pay | Admitting: Family Medicine

## 2022-10-22 DIAGNOSIS — Z1231 Encounter for screening mammogram for malignant neoplasm of breast: Secondary | ICD-10-CM

## 2022-10-30 ENCOUNTER — Encounter (HOSPITAL_COMMUNITY): Payer: Self-pay

## 2022-10-30 ENCOUNTER — Ambulatory Visit (HOSPITAL_COMMUNITY)
Admission: RE | Admit: 2022-10-30 | Discharge: 2022-10-30 | Disposition: A | Discharge status: Home / Self Care | Payer: Medicare Other | Source: Ambulatory Visit | Attending: Family Medicine | Admitting: Family Medicine

## 2022-10-30 DIAGNOSIS — Z1231 Encounter for screening mammogram for malignant neoplasm of breast: Secondary | ICD-10-CM

## 2023-02-22 ENCOUNTER — Other Ambulatory Visit: Payer: Self-pay | Admitting: Family Medicine

## 2023-02-22 DIAGNOSIS — M810 Age-related osteoporosis without current pathological fracture: Secondary | ICD-10-CM

## 2023-08-04 ENCOUNTER — Other Ambulatory Visit: Payer: Self-pay | Admitting: Family Medicine

## 2023-08-04 DIAGNOSIS — Z1231 Encounter for screening mammogram for malignant neoplasm of breast: Secondary | ICD-10-CM

## 2023-08-09 ENCOUNTER — Encounter (INDEPENDENT_AMBULATORY_CARE_PROVIDER_SITE_OTHER): Payer: Self-pay | Admitting: *Deleted

## 2023-09-02 ENCOUNTER — Encounter: Payer: Self-pay | Admitting: Internal Medicine

## 2023-09-02 ENCOUNTER — Encounter: Payer: Self-pay | Admitting: *Deleted

## 2023-09-02 ENCOUNTER — Ambulatory Visit: Admitting: Internal Medicine

## 2023-09-02 VITALS — BP 133/74 | HR 65 | Temp 98.6°F | Ht 66.0 in | Wt 154.2 lb

## 2023-09-02 DIAGNOSIS — Z860101 Personal history of adenomatous and serrated colon polyps: Secondary | ICD-10-CM | POA: Diagnosis not present

## 2023-09-02 DIAGNOSIS — K219 Gastro-esophageal reflux disease without esophagitis: Secondary | ICD-10-CM

## 2023-09-02 NOTE — H&P (View-Only) (Signed)
 Primary Care Physician:  [facility] Primary Gastroenterologist:  Dr. Mordechai April  Chief Complaint  Patient presents with   New Patient (Initial Visit)    Pt here for ov for colonoscopy    HPI:   Diane Graves is a 73 y.o. female who presents to clinic today to discuss scheduling colonoscopy.  Last colonoscopy 2019 with multiple polyps removed, 1 tubular adenoma.  Colonoscopy prior to this in 2011 also with polyps removed per patient though I do not have access to this report.  No family history of colorectal malignancy.  No melena hematochezia.  No unintentional weight loss.  No abdominal pain.  Does have chronic GERD for which she takes Pepcid daily.  Symptoms well-controlled.  Denies any dysphagia odynophagia.  No epigastric or chest pain.  Past Medical History:  Diagnosis Date   Anxiety    Arthritis    Diabetes (HCC)    GERD (gastroesophageal reflux disease)    Heartburn    HLD (hyperlipidemia)    HTN (hypertension)    Lung nodules    Uterine cancer [facility])     Past Surgical History:  Procedure Laterality Date   ABDOMINAL HYSTERECTOMY     ABDOMINAL HYSTERECTOMY     CATARACT EXTRACTION W/PHACO Right 09/18/2022   Procedure: CATARACT EXTRACTION PHACO AND INTRAOCULAR LENS PLACEMENT (IOC);  Surgeon: Ardeth Krabbe, MD;  Location: [facility];  Service: Ophthalmology;  Laterality: Right;  CDE: 3.23   CATARACT EXTRACTION W/PHACO Left 10/02/2022   Procedure: CATARACT EXTRACTION PHACO AND INTRAOCULAR LENS PLACEMENT (IOC);  Surgeon: Ardeth Krabbe, MD;  Location: [facility];  Service: Ophthalmology;  Laterality: Left;  CDE:2.53   DILATION AND CURETTAGE OF UTERUS     EYE SURGERY      Current Outpatient Medications  Medication Sig Dispense Refill   acetaminophen (TYLENOL) 325 MG tablet Take 650 mg by mouth every 6 (six) hours as needed for mild pain or headache.     amLODipine-benazepril (LOTREL) 10-40 MG capsule Take 1 capsule by mouth daily.      aspirin 81 MG  chewable tablet Chew 81 mg by mouth daily.      Cholecalciferol (VITAMIN D3) 125 MCG (5000 UT) CAPS Take 5,000 Units by mouth daily.      famotidine (PEPCID) 40 MG tablet Take 40 mg by mouth daily.     fluticasone (FLONASE) 50 MCG/ACT nasal spray Place 2 sprays into both nostrils daily.      metFORMIN (GLUCOPHAGE-XR) 500 MG 24 hr tablet Take 500 mg by mouth daily.      Multiple Vitamin (MULTIVITAMIN) capsule Take 1 capsule by mouth daily.     pravastatin (PRAVACHOL) 40 MG tablet Take 40 mg by mouth daily.      No current facility-administered medications for this visit.    Allergies as of 09/02/2023 - Review Complete 09/02/2023  Allergen Reaction Noted   Ciprofloxacin Shortness Of Breath, Diarrhea, and Other (See Comments) 05/22/2016   Amitriptyline Other (See Comments) 02/13/2020    Family History  Problem Relation Age of Onset   Breast cancer Sister 21   Kidney cancer Neg Hx    Bladder Cancer Neg Hx     Social History   Socioeconomic History   Marital status: Widowed    Spouse name: Not on file   Number of children: Not on file   Years of education: Not on file   Highest education level: Not on file  Occupational History   Not on file  Tobacco Use  Smoking status: Never   Smokeless tobacco: Never  Substance and Sexual Activity   Alcohol use: No   Drug use: No   Sexual activity: Not on file  Other Topics Concern   Not on file  Social History Narrative   Not on file   Social Drivers of Health   Financial Resource Strain: Low Risk  (10/14/2020)   Received from [facility], [facility]   Overall Financial Resource Strain (CARDIA)    Difficulty of Paying Living Expenses: Not hard at all  Food Insecurity: No Food Insecurity (10/14/2020)   Received from [facility], [facility]   Hunger Vital Sign    Worried About Running Out of Food in the Last Year: Never true    Ran Out of Food in the Last Year: Never true  Transportation Needs: No Transportation  Needs (10/14/2020)   Received from [facility], [facility]   [facility] - Transportation    Lack of Transportation (Medical): No    Lack of Transportation (Non-Medical): No  Physical Activity: Not on file  Stress: Not on file  Social Connections: Not on file  Intimate Partner Violence: Not At Risk (10/14/2020)   Received from [facility]   Humiliation, Afraid, Rape, and Kick questionnaire    Fear of Current or Ex-Partner: No    Emotionally Abused: No    Physically Abused: No    Sexually Abused: No    Subjective: Review of Systems  Constitutional:  Negative for chills and fever.  HENT:  Negative for congestion and hearing loss.   Eyes:  Negative for blurred vision and double vision.  Respiratory:  Negative for cough and shortness of breath.   Cardiovascular:  Negative for chest pain and palpitations.  Gastrointestinal:  Positive for heartburn. Negative for abdominal pain, blood in stool, constipation, diarrhea, melena and vomiting.  Genitourinary:  Negative for dysuria and urgency.  Musculoskeletal:  Negative for joint pain and myalgias.  Skin:  Negative for itching and rash.  Neurological:  Negative for dizziness and headaches.  Psychiatric/Behavioral:  Negative for depression. The patient is not nervous/anxious.        Objective: BP 133/74   Pulse 65   Temp 98.6 F (37 C)   Ht 5\' 6"  (1.676 m)   Wt 154 lb 3.2 oz (69.9 kg)   BMI 24.89 kg/m  Physical Exam Constitutional:      Appearance: Normal appearance.  HENT:     Head: Normocephalic and atraumatic.  Eyes:     Extraocular Movements: Extraocular movements intact.     Conjunctiva/sclera: Conjunctivae normal.  Cardiovascular:     Rate and Rhythm: Normal rate and regular rhythm.  Pulmonary:     Effort: Pulmonary effort is normal.     Breath sounds: Normal breath sounds.  Abdominal:     General: Bowel sounds are normal.     Palpations: Abdomen is soft.  Musculoskeletal:        General: No  swelling. Normal range of motion.     Cervical back: Normal range of motion and neck supple.  Skin:    General: Skin is warm and dry.     Coloration: Skin is not jaundiced.  Neurological:     General: No focal deficit present.     Mental Status: She is alert and oriented to person, place, and time.  Psychiatric:        Mood and Affect: Mood normal.  Behavior: Behavior normal.      Assessment/Plan:  1.  Personal history of adenomatous colon polyps-will schedule for surveillance colonoscopy today. The risks including infection, bleed, or perforation as well as benefits, limitations, alternatives and imponderables have been reviewed with the patient. Questions have been answered. All parties agreeable.  2.  Chronic GERD-well-controlled on Pepcid daily.  Will continue.  Follow-up to be determined.  09/02/2023 2:24 PM   Disclaimer: This note was dictated with voice recognition software. Similar sounding words can inadvertently be transcribed and may not be corrected upon review.

## 2023-09-02 NOTE — Progress Notes (Signed)
 Primary Care Physician:  [facility] Gastroenterologist:  Dr. Mordechai April  Chief Complaint  Patient presents with   New Patient (Initial Visit)    Pt here for ov for colonoscopy    HPI:   Diane Graves is a 73 y.o. female who presents to clinic today to discuss scheduling colonoscopy.  Last colonoscopy 2019 with multiple polyps removed, 1 tubular adenoma.  Colonoscopy prior to this in 2011 also with polyps removed per patient though I do not have access to this report.  No family history of colorectal malignancy.  No melena hematochezia.  No unintentional weight loss.  No abdominal pain.  Does have chronic GERD for which she takes Pepcid daily.  Symptoms well-controlled.  Denies any dysphagia odynophagia.  No epigastric or chest pain.  Past Medical History:  Diagnosis Date   Anxiety    Arthritis    Diabetes (HCC)    GERD (gastroesophageal reflux disease)    Heartburn    HLD (hyperlipidemia)    HTN (hypertension)    Lung nodules    Uterine cancer [facility])     Past Surgical History:  Procedure Laterality Date   ABDOMINAL HYSTERECTOMY     ABDOMINAL HYSTERECTOMY     CATARACT EXTRACTION W/PHACO Right 09/18/2022   Procedure: CATARACT EXTRACTION PHACO AND INTRAOCULAR LENS PLACEMENT (IOC);  Surgeon: Ardeth Krabbe, MD;  Location: [facility];  Service: Ophthalmology;  Laterality: Right;  CDE: 3.23   CATARACT EXTRACTION W/PHACO Left 10/02/2022   Procedure: CATARACT EXTRACTION PHACO AND INTRAOCULAR LENS PLACEMENT (IOC);  Surgeon: Ardeth Krabbe, MD;  Location: [facility];  Service: Ophthalmology;  Laterality: Left;  CDE:2.53   DILATION AND CURETTAGE OF UTERUS     EYE SURGERY      Current Outpatient Medications  Medication Sig Dispense Refill   acetaminophen (TYLENOL) 325 MG tablet Take 650 mg by mouth every 6 (six) hours as needed for mild pain or headache.     amLODipine-benazepril (LOTREL) 10-40 MG capsule Take 1 capsule by mouth daily.      aspirin 81 MG  chewable tablet Chew 81 mg by mouth daily.      Cholecalciferol (VITAMIN D3) 125 MCG (5000 UT) CAPS Take 5,000 Units by mouth daily.      famotidine (PEPCID) 40 MG tablet Take 40 mg by mouth daily.     fluticasone (FLONASE) 50 MCG/ACT nasal spray Place 2 sprays into both nostrils daily.      metFORMIN (GLUCOPHAGE-XR) 500 MG 24 hr tablet Take 500 mg by mouth daily.      Multiple Vitamin (MULTIVITAMIN) capsule Take 1 capsule by mouth daily.     pravastatin (PRAVACHOL) 40 MG tablet Take 40 mg by mouth daily.      No current facility-administered medications for this visit.    Allergies as of 09/02/2023 - Review Complete 09/02/2023  Allergen Reaction Noted   Ciprofloxacin Shortness Of Breath, Diarrhea, and Other (See Comments) 05/22/2016   Amitriptyline Other (See Comments) 02/13/2020    Family History  Problem Relation Age of Onset   Breast cancer Sister 21   Kidney cancer Neg Hx    Bladder Cancer Neg Hx     Social History   Socioeconomic History   Marital status: Widowed    Spouse name: Not on file   Number of children: Not on file   Years of education: Not on file   Highest education level: Not on file  Occupational History   Not on file  Tobacco Use  Smoking status: Never   Smokeless tobacco: Never  Substance and Sexual Activity   Alcohol use: No   Drug use: No   Sexual activity: Not on file  Other Topics Concern   Not on file  Social History Narrative   Not on file   Social Drivers of Health   Financial Resource Strain: Low Risk  (10/14/2020)   Received from [facility], [facility]   Overall Financial Resource Strain (CARDIA)    Difficulty of Paying Living Expenses: Not hard at all  Food Insecurity: No Food Insecurity (10/14/2020)   Received from [facility], [facility]   Hunger Vital Sign    Worried About Running Out of Food in the Last Year: Never true    Ran Out of Food in the Last Year: Never true  Transportation Needs: No Transportation  Needs (10/14/2020)   Received from [facility], [facility]   [facility] - Transportation    Lack of Transportation (Medical): No    Lack of Transportation (Non-Medical): No  Physical Activity: Not on file  Stress: Not on file  Social Connections: Not on file  Intimate Partner Violence: Not At Risk (10/14/2020)   Received from [facility]   Humiliation, Afraid, Rape, and Kick questionnaire    Fear of Current or Ex-Partner: No    Emotionally Abused: No    Physically Abused: No    Sexually Abused: No    Subjective: Review of Systems  Constitutional:  Negative for chills and fever.  HENT:  Negative for congestion and hearing loss.   Eyes:  Negative for blurred vision and double vision.  Respiratory:  Negative for cough and shortness of breath.   Cardiovascular:  Negative for chest pain and palpitations.  Gastrointestinal:  Positive for heartburn. Negative for abdominal pain, blood in stool, constipation, diarrhea, melena and vomiting.  Genitourinary:  Negative for dysuria and urgency.  Musculoskeletal:  Negative for joint pain and myalgias.  Skin:  Negative for itching and rash.  Neurological:  Negative for dizziness and headaches.  Psychiatric/Behavioral:  Negative for depression. The patient is not nervous/anxious.        Objective: BP 133/74   Pulse 65   Temp 98.6 F (37 C)   Ht 5\' 6"  (1.676 m)   Wt 154 lb 3.2 oz (69.9 kg)   BMI 24.89 kg/m  Physical Exam Constitutional:      Appearance: Normal appearance.  HENT:     Head: Normocephalic and atraumatic.  Eyes:     Extraocular Movements: Extraocular movements intact.     Conjunctiva/sclera: Conjunctivae normal.  Cardiovascular:     Rate and Rhythm: Normal rate and regular rhythm.  Pulmonary:     Effort: Pulmonary effort is normal.     Breath sounds: Normal breath sounds.  Abdominal:     General: Bowel sounds are normal.     Palpations: Abdomen is soft.  Musculoskeletal:        General: No  swelling. Normal range of motion.     Cervical back: Normal range of motion and neck supple.  Skin:    General: Skin is warm and dry.     Coloration: Skin is not jaundiced.  Neurological:     General: No focal deficit present.     Mental Status: She is alert and oriented to person, place, and time.  Psychiatric:        Mood and Affect: Mood normal.  Behavior: Behavior normal.      Assessment/Plan:  1.  Personal history of adenomatous colon polyps-will schedule for surveillance colonoscopy today. The risks including infection, bleed, or perforation as well as benefits, limitations, alternatives and imponderables have been reviewed with the patient. Questions have been answered. All parties agreeable.  2.  Chronic GERD-well-controlled on Pepcid daily.  Will continue.  Follow-up to be determined.  09/02/2023 2:24 PM   Disclaimer: This note was dictated with voice recognition software. Similar sounding words can inadvertently be transcribed and may not be corrected upon review.

## 2023-09-02 NOTE — Patient Instructions (Signed)
 We will schedule you for colonoscopy for colon cancer screening purposes.  Continue on famotidine for your chronic reflux.  It was very nice meeting you today.  Dr. Mordechai April

## 2023-09-07 NOTE — Patient Instructions (Signed)
 Monitored Anest   Diane Graves  09/07/2023     @PREFPERIOPPHARMACY @   Your procedure is scheduled on 5/5.  Report to [facility] at 1215 p.M.  Call this number if you have problems the morning of surgery:  [phone]  If you experience any cold or flu symptoms such as cough, fever, chills, shortness of breath, etc. between now and your scheduled surgery, please notify us  at the above number.   Remember:  Do not eat or drink after midnight.  You may drink clear liquids until 1015 .  Clear liquids allowed are:                    Water , Juice (No red color; non-citric and without pulp; diabetics please choose diet or no sugar options), Carbonated beverages (diabetics please choose diet or no sugar options), Clear Tea (No creamer, milk, or cream, including half & half and powdered creamer), and Black Coffee Only (No creamer, milk or cream, including half & half and powdered creamer)    Take these medicines the morning of surgery with A SIP OF WATER  pepcid    Do not wear jewelry, make-up or nail polish, including gel polish,  artificial nails, or any other type of covering on natural nails (fingers and  toes).  Do not wear lotions, powders, or perfumes, or deodorant.  Do not shave 48 hours prior to surgery.  Men may shave face and neck.  Do not bring valuables to the hospital.  [facility] is not responsible for any belongings or valuables.  Contacts, dentures or bridgework may not be worn into surgery.  Leave your suitcase in the car.  After surgery it may be brought to your room.  For patients admitted to the hospital, discharge time will be determined by your treatment team.  Patients discharged the day of surgery will not be allowed to drive home.   Name and phone number of your driver:   family Special instructions:  Please follow special diet and prep instructions given to you by Dr Cherisse Cornell office,  Please read over the following fact sheets that you were given. Anesthesia  Post-op Instructions and Care and Recovery After Surgery      Colonoscopy, Adult A colonoscopy is a procedure to look at the entire large intestine. This procedure is done using a long, thin, flexible tube that has a camera on the end. You may have a colonoscopy: As a part of normal colorectal screening. If you have certain symptoms, such as: A low number of red blood cells in your blood (anemia). Diarrhea that does not go away. Pain in your abdomen. Blood in your stool. A colonoscopy can help screen for and diagnose medical problems, including: An abnormal growth of cells or tissue (tumor). Abnormal growths within the lining of your intestine (polyps). Inflammation. Areas of bleeding. Tell your health care provider about: Any allergies you have. All medicines you are taking, including vitamins, herbs, eye drops, creams, and over-the-counter medicines. Any problems you or family members have had with anesthetic medicines. Any bleeding problems you have. Any surgeries you have had. Any medical conditions you have. Any problems you have had with having bowel movements. Whether you are pregnant or may be pregnant. What are the risks? Generally, this is a safe procedure. However, problems may occur, including: Bleeding. Damage to your intestine. Allergic reactions to medicines given during the procedure. Infection. This is rare. What happens before the procedure? Eating and drinking restrictions Follow instructions from your  health care provider about eating or drinking restrictions, which may include: A few days before the procedure: Follow a low-fiber diet. Avoid nuts, seeds, dried fruit, raw fruits, and vegetables. 1-3 days before the procedure: Eat only gelatin dessert or ice pops. Drink only clear liquids, such as water , clear juice, clear broth or bouillon, black coffee or tea, or clear soft drinks or sports drinks. Avoid liquids that contain red or purple dye. The day of  the procedure: Do not eat solid foods. You may continue to drink clear liquids until up to 2 hours before the procedure. Do not eat or drink anything starting 2 hours before the procedure, or within the time period that your health care provider recommends. Bowel prep If you were prescribed a bowel prep to take by mouth (orally) to clean out your colon: Take it as told by your health care provider. Starting the day before your procedure, you will need to drink a large amount of liquid medicine. The liquid will cause you to have many bowel movements of loose stool until your stool becomes almost clear or light green. If your skin or the opening between the buttocks (anus) gets irritated from diarrhea, you may relieve the irritation using: Wipes with medicine in them, such as adult wet wipes with aloe and vitamin E. A product to soothe skin, such as petroleum jelly. If you vomit while drinking the bowel prep: Take a break for up to 60 minutes. Begin the bowel prep again. Call your health care provider if you keep vomiting or you cannot take the bowel prep without vomiting. To clean out your colon, you may also be given: Laxative medicines. These help you have a bowel movement. Instructions for enema use. An enema is liquid medicine injected into your rectum. Medicines Ask your health care provider about: Changing or stopping your regular medicines or supplements. This is especially important if you are taking iron supplements, diabetes medicines, or blood thinners. Taking medicines such as aspirin and ibuprofen. These medicines can thin your blood. Do not take these medicines unless your health care provider tells you to take them. Taking over-the-counter medicines, vitamins, herbs, and supplements. General instructions Ask your health care provider what steps will be taken to help prevent infection. These may include washing skin with a germ-killing soap. If you will be going home right after  the procedure, plan to have a responsible adult: Take you home from the hospital or clinic. You will not be allowed to drive. Care for you for the time you are told. What happens during the procedure?  An IV will be inserted into one of your veins. You will be given a medicine to make you fall asleep (general anesthetic). You will lie on your side with your knees bent. A lubricant will be put on the tube. Then the tube will be: Inserted into your anus. Gently eased through all parts of your large intestine. Air will be sent into your colon to keep it open. This may cause some pressure or cramping. Images will be taken with the camera and will appear on a screen. A small tissue sample may be removed to be looked at under a microscope (biopsy). The tissue may be sent to a lab for testing if any signs of problems are found. If small polyps are found, they may be removed and checked for cancer cells. When the procedure is finished, the tube will be removed. The procedure may vary among health care providers and hospitals. What happens  after the procedure? Your blood pressure, heart rate, breathing rate, and blood oxygen level will be monitored until you leave the hospital or clinic. You may have a small amount of blood in your stool. You may pass gas and have mild cramping or bloating in your abdomen. This is caused by the air that was used to open your colon during the exam. If you were given a sedative during the procedure, it can affect you for several hours. Do not drive or operate machinery until your health care provider says that it is safe. It is up to you to get the results of your procedure. Ask your health care provider, or the department that is doing the procedure, when your results will be ready. Summary A colonoscopy is a procedure to look at the entire large intestine. Follow instructions from your health care provider about eating and drinking before the procedure. If you were  prescribed an oral bowel prep to clean out your colon, take it as told by your health care provider. During the colonoscopy, a flexible tube with a camera on its end is inserted into the anus and then passed into all parts of the large intestine. This information is not intended to replace advice given to you by your health care provider. Make sure you discuss any questions you have with your health care provider. Document Revised: 06/09/2022 Document Reviewed: 12/18/2020 Elsevier Patient Education  2024 [employer]Colonoscopy, Adult, Care After The following information offers guidance on how to care for yourself after your procedure. Your health care provider may also give you more specific instructions. If you have problems or questions, contact your health care provider. What can I expect after the procedure? After the procedure, it is common to have: A small amount of blood in your stool for 24 hours after the procedure. Some gas. Mild cramping or bloating of your abdomen. Follow these instructions at home: Eating and drinking  Drink enough fluid to keep your urine pale yellow. Follow instructions from your health care provider about eating or drinking restrictions. Resume your normal diet as told by your health care provider. Avoid heavy or fried foods that are hard to digest. Activity Rest as told by your health care provider. Avoid sitting for a long time without moving. Get up to take short walks every 1-2 hours. This is important to improve blood flow and breathing. Ask for help if you feel weak or unsteady. Return to your normal activities as told by your health care provider. Ask your health care provider what activities are safe for you. Managing cramping and bloating  Try walking around when you have cramps or feel bloated. If directed, apply heat to your abdomen as told by your health care provider. Use the heat source that your health care provider recommends, such as a  moist heat pack or a heating pad. Place a towel between your skin and the heat source. Leave the heat on for 20-30 minutes. Remove the heat if your skin turns bright red. This is especially important if you are unable to feel pain, heat, or cold. You have a greater risk of getting burned. General instructions If you were given a sedative during the procedure, it can affect you for several hours. Do not drive or operate machinery until your health care provider says that it is safe. For the first 24 hours after the procedure: Do not sign important documents. Do not drink alcohol. Do your regular daily activities at a slower pace than  normal. Eat soft foods that are easy to digest. Take over-the-counter and prescription medicines only as told by your health care provider. Keep all follow-up visits. This is important. Contact a health care provider if: You have blood in your stool 2-3 days after the procedure. Get help right away if: You have more than a small spotting of blood in your stool. You have large blood clots in your stool. You have swelling of your abdomen. You have nausea or vomiting. You have a fever. You have increasing pain in your abdomen that is not relieved with medicine. These symptoms may be an emergency. Get help right away. Call 911. Do not wait to see if the symptoms will go away. Do not drive yourself to the hospital. Summary After the procedure, it is common to have a small amount of blood in your stool. You may also have mild cramping and bloating of your abdomen. If you were given a sedative during the procedure, it can affect you for several hours. Do not drive or operate machinery until your health care provider says that it is safe. Get help right away if you have a lot of blood in your stool, nausea or vomiting, a fever, or increased pain in your abdomen. This information is not intended to replace advice given to you by your health care provider. Make sure  you discuss any questions you have with your health care provider. Document Revised: 06/09/2022 Document Reviewed: 12/18/2020 Elsevier Patient Education  2024 [employer]hesia Care, Care After The following information offers guidance on how to care for yourself after your procedure. Your health care provider may also give you more specific instructions. If you have problems or questions, contact your health care provider. What can I expect after the procedure? After the procedure, it is common to have: Tiredness. Little or no memory about what happened during or after the procedure. Impaired judgment when it comes to making decisions. Nausea or vomiting. Some trouble with balance. Follow these instructions at home: For the time period you were told by your health care provider:  Rest. Do not participate in activities where you could fall or become injured. Do not drive or use machinery. Do not drink alcohol. Do not take sleeping pills or medicines that cause drowsiness. Do not make important decisions or sign legal documents. Do not take care of children on your own. Medicines Take over-the-counter and prescription medicines only as told by your health care provider. If you were prescribed antibiotics, take them as told by your health care provider. Do not stop using the antibiotic even if you start to feel better. Eating and drinking Follow instructions from your health care provider about what you may eat and drink. Drink enough fluid to keep your urine pale yellow. If you vomit: Drink clear fluids slowly and in small amounts as you are able. Clear fluids include water , ice chips, low-calorie sports drinks, and fruit juice that has water  added to it (diluted fruit juice). Eat light and bland foods in small amounts as you are able. These foods include bananas, applesauce, rice, lean meats, toast, and crackers. General instructions  Have a responsible adult stay with you for the  time you are told. It is important to have someone help care for you until you are awake and alert. If you have sleep apnea, surgery and some medicines can increase your risk for breathing problems. Follow instructions from your health care provider about wearing your sleep device: When you are sleeping. This  includes during daytime naps. While taking prescription pain medicines, sleeping medicines, or medicines that make you drowsy. Do not use any products that contain nicotine or tobacco. These products include cigarettes, chewing tobacco, and vaping devices, such as e-cigarettes. If you need help quitting, ask your health care provider. Contact a health care provider if: You feel nauseous or vomit every time you eat or drink. You feel light-headed. You are still sleepy or having trouble with balance after 24 hours. You get a rash. You have a fever. You have redness or swelling around the IV site. Get help right away if: You have trouble breathing. You have new confusion after you get home. These symptoms may be an emergency. Get help right away. Call 911. Do not wait to see if the symptoms will go away. Do not drive yourself to the hospital. This information is not intended to replace advice given to you by your health care provider. Make sure you discuss any questions you have with your health care provider. Document Revised: 09/22/2021 Document Reviewed: 09/22/2021 Elsevier Patient Education  2024 [employer].

## 2023-09-08 ENCOUNTER — Encounter (HOSPITAL_COMMUNITY)
Admission: RE | Admit: 2023-09-08 | Discharge: 2023-09-08 | Disposition: A | Discharge status: Home / Self Care | Source: Ambulatory Visit | Attending: Internal Medicine | Admitting: Internal Medicine

## 2023-09-08 ENCOUNTER — Encounter (HOSPITAL_COMMUNITY): Payer: Self-pay

## 2023-09-08 VITALS — BP 133/74 | HR 65 | Temp 98.6°F | Resp 18 | Ht 66.0 in | Wt 154.2 lb

## 2023-09-08 DIAGNOSIS — Z01818 Encounter for other preprocedural examination: Secondary | ICD-10-CM

## 2023-09-08 DIAGNOSIS — Z01812 Encounter for preprocedural laboratory examination: Secondary | ICD-10-CM | POA: Diagnosis present

## 2023-09-08 LAB — BASIC METABOLIC PANEL WITH GFR
Anion gap: 8 (ref 5–15)
BUN: 11 mg/dL (ref 8–23)
CO2: 23 mmol/L (ref 22–32)
Calcium: 9.3 mg/dL (ref 8.9–10.3)
Chloride: 104 mmol/L (ref 98–111)
Creatinine, Ser: 0.75 mg/dL (ref 0.44–1.00)
GFR, Estimated: >60 mL/min (ref >60)
Glucose, Bld: 147 mg/dL — ABNORMAL HIGH (ref 70–99)
Potassium: 3.5 mmol/L (ref 3.5–5.1)
Sodium: 135 mmol/L (ref 135–145)

## 2023-09-13 ENCOUNTER — Ambulatory Visit (HOSPITAL_COMMUNITY)
Admission: RE | Admit: 2023-09-13 | Discharge: 2023-09-13 | Disposition: A | Discharge status: Home / Self Care | Attending: Internal Medicine | Admitting: Internal Medicine

## 2023-09-13 ENCOUNTER — Encounter (HOSPITAL_COMMUNITY): Payer: Self-pay | Admitting: Internal Medicine

## 2023-09-13 ENCOUNTER — Ambulatory Visit (HOSPITAL_COMMUNITY): Admitting: Anesthesiology

## 2023-09-13 ENCOUNTER — Encounter (HOSPITAL_COMMUNITY)
Admission: RE | Disposition: A | Discharge status: Home / Self Care | Payer: Self-pay | Source: Home / Self Care | Attending: Internal Medicine

## 2023-09-13 DIAGNOSIS — Z79899 Other long term (current) drug therapy: Secondary | ICD-10-CM | POA: Insufficient documentation

## 2023-09-13 DIAGNOSIS — K648 Other hemorrhoids: Secondary | ICD-10-CM | POA: Diagnosis not present

## 2023-09-13 DIAGNOSIS — I10 Essential (primary) hypertension: Secondary | ICD-10-CM | POA: Insufficient documentation

## 2023-09-13 DIAGNOSIS — D123 Benign neoplasm of transverse colon: Secondary | ICD-10-CM

## 2023-09-13 DIAGNOSIS — Z1211 Encounter for screening for malignant neoplasm of colon: Secondary | ICD-10-CM

## 2023-09-13 DIAGNOSIS — K219 Gastro-esophageal reflux disease without esophagitis: Secondary | ICD-10-CM | POA: Insufficient documentation

## 2023-09-13 DIAGNOSIS — Z860100 Personal history of colon polyps, unspecified: Secondary | ICD-10-CM

## 2023-09-13 HISTORY — PX: COLONOSCOPY: SHX5424

## 2023-09-13 LAB — GLUCOSE, CAPILLARY: Glucose-Capillary: 99 mg/dL (ref 70–99)

## 2023-09-13 SURGERY — COLONOSCOPY
Anesthesia: General

## 2023-09-13 MED ORDER — LACTATED RINGERS IV SOLN: INTRAVENOUS | Status: DC

## 2023-09-13 MED ORDER — PROPOFOL 500 MG/50ML IV EMUL
INTRAVENOUS | Status: DC | PRN
  Administered 2023-09-13: 150 ug/kg/min via INTRAVENOUS
  Administered 2023-09-13: 100 mg via INTRAVENOUS

## 2023-09-13 NOTE — Transfer of Care (Signed)
 Immediate Anesthesia Transfer of Care Note  Patient: Diane Graves  Procedure(s) Performed: COLONOSCOPY  Patient Location: Short Stay  Anesthesia Type:General  Level of Consciousness: drowsy  Airway & Oxygen Therapy: Patient Spontanous Breathing  Post-op Assessment: Report given to RN and Post -op Vital signs reviewed and stable  Post vital signs: Reviewed and stable  Last Vitals:  Vitals Value Taken Time  BP 97/49   Temp 97.5   Pulse 56 09/13/23 1344  Resp 36 09/13/23 1344  SpO2 97 % 09/13/23 1344  Vitals shown include unfiled device data.  Last Pain:  Vitals:   09/13/23 1320  TempSrc:   PainSc: 0-No pain         Complications: No notable events documented.

## 2023-09-13 NOTE — Interval H&P Note (Signed)
 History and Physical Interval Note:  09/13/2023 1:13 PM  Diane Graves  has presented today for surgery, with the diagnosis of history of polpys.  The various methods of treatment have been discussed with the patient and family. After consideration of risks, benefits and other options for treatment, the patient has consented to  Procedure(s) with comments: COLONOSCOPY (N/A) - 2:15 pm, asa 3 as a surgical intervention.  The patient's history has been reviewed, patient examined, no change in status, stable for surgery.  I have reviewed the patient's chart and labs.  Questions were answered to the patient's satisfaction.     Vinetta Greening

## 2023-09-13 NOTE — Discharge Instructions (Addendum)
  Colonoscopy Discharge Instructions  Read the instructions outlined below and refer to this sheet in the next few weeks. These discharge instructions provide you with general information on caring for yourself after you leave the hospital. Your doctor may also give you specific instructions. While your treatment has been planned according to the most current medical practices available, unavoidable complications occasionally occur.   ACTIVITY You may resume your regular activity, but move at a slower pace for the next 24 hours.  Take frequent rest periods for the next 24 hours.  Walking will help get rid of the air and reduce the bloated feeling in your belly (abdomen).  No driving for 24 hours (because of the medicine (anesthesia) used during the test).   Do not sign any important legal documents or operate any machinery for 24 hours (because of the anesthesia used during the test).  NUTRITION Drink plenty of fluids.  You may resume your normal diet as instructed by your doctor.  Begin with a light meal and progress to your normal diet. Heavy or fried foods are harder to digest and may make you feel sick to your stomach (nauseated).  Avoid alcoholic beverages for 24 hours or as instructed.  MEDICATIONS You may resume your normal medications unless your doctor tells you otherwise.  WHAT YOU CAN EXPECT TODAY Some feelings of bloating in the abdomen.  Passage of more gas than usual.  Spotting of blood in your stool or on the toilet paper.  IF YOU HAD POLYPS REMOVED DURING THE COLONOSCOPY: No aspirin products for 7 days or as instructed.  No alcohol for 7 days or as instructed.  Eat a soft diet for the next 24 hours.  FINDING OUT THE RESULTS OF YOUR TEST Not all test results are available during your visit. If your test results are not back during the visit, make an appointment with your caregiver to find out the results. Do not assume everything is normal if you have not heard from your  caregiver or the medical facility. It is important for you to follow up on all of your test results.  SEEK IMMEDIATE MEDICAL ATTENTION IF: You have more than a spotting of blood in your stool.  Your belly is swollen (abdominal distention).  You are nauseated or vomiting.  You have a temperature over 101.  You have abdominal pain or discomfort that is severe or gets worse throughout the day.   Your colonoscopy revealed 1 polyp(s) which I removed successfully. Await pathology results, my office will contact you.  Given your age, I do not think we need to perform further colonoscopies for polyp surveillance.  Follow-up with GI as needed.   I hope you have a great rest of your week!  Rolando Cliche. Mordechai April, D.O. Gastroenterology and Hepatology [facility]

## 2023-09-13 NOTE — Anesthesia Preprocedure Evaluation (Signed)
Anesthesia Evaluation  Patient identified by MRN, date of birth, ID band Patient awake    Reviewed: Allergy & Precautions, H&P , NPO status , Patient's Chart, lab work & pertinent test results, reviewed documented beta blocker date and time   Airway Mallampati: II  TM Distance: >3 FB Neck ROM: full    Dental no notable dental hx.    Pulmonary neg pulmonary ROS   Pulmonary exam normal breath sounds clear to auscultation       Cardiovascular Exercise Tolerance: Good hypertension, negative cardio ROS  Rhythm:regular Rate:Normal     Neuro/Psych   Anxiety     negative neurological ROS  negative psych ROS   GI/Hepatic negative GI ROS, Neg liver ROS,GERD  ,,  Endo/Other  negative endocrine ROSdiabetes    Renal/GU negative Renal ROS  negative genitourinary   Musculoskeletal   Abdominal   Peds  Hematology negative hematology ROS (+)   Anesthesia Other Findings   Reproductive/Obstetrics negative OB ROS                             Anesthesia Physical Anesthesia Plan  ASA: 2  Anesthesia Plan: General   Post-op Pain Management:    Induction:   PONV Risk Score and Plan: Propofol infusion  Airway Management Planned:   Additional Equipment:   Intra-op Plan:   Post-operative Plan:   Informed Consent: I have reviewed the patients History and Physical, chart, labs and discussed the procedure including the risks, benefits and alternatives for the proposed anesthesia with the patient or authorized representative who has indicated his/her understanding and acceptance.     Dental Advisory Given  Plan Discussed with: CRNA  Anesthesia Plan Comments:        Anesthesia Quick Evaluation

## 2023-09-13 NOTE — Op Note (Signed)
 [facility] Patient Name: Diane Graves Procedure Date: 09/13/2023 1:04 PM MRN: [id number] Date of Birth: [birth year] Attending MD: Rolando Cliche. Mordechai April , [place], [id number] CSN: [id number] Age: 73 Admit Type: Outpatient Procedure:                Colonoscopy Indications:              Surveillance: Personal history of colonic polyps                            (unknown histology) on last colonoscopy more than 5                            years ago Providers:                Rolando Cliche. Mordechai April, DO, Graydon Lazier RN, RN, Kimball Pence Tech, Technician Referring MD:              Medicines:                See the Anesthesia note for documentation of the                            administered medications Complications:            No immediate complications. Estimated Blood Loss:     Estimated blood loss was minimal. Procedure:                Pre-Anesthesia Assessment:                           - The anesthesia plan was to use monitored                            anesthesia care (MAC).                           After obtaining informed consent, the colonoscope                            was passed under direct vision. Throughout the                            procedure, the patient's blood pressure, pulse, and                            oxygen saturations were monitored continuously. The                            [id number]-HQ190L ([id number]) scope was introduced through                            the anus and advanced to the the cecum, identified                            by appendiceal orifice and ileocecal  valve. The                            colonoscopy was performed without difficulty. The                            patient tolerated the procedure well. The quality                            of the bowel preparation was evaluated using the                            BBPS [facility] Bowel Preparation Scale) with scores                            of: Right Colon = 3,  Transverse Colon = 3 and Left                            Colon = 3 (entire mucosa seen well with no residual                            staining, small fragments of stool or opaque                            liquid). The total BBPS score equals 9. Scope In: 1:24:27 PM Scope Out: 1:36:16 PM Scope Withdrawal Time: 0 hours 7 minutes 54 seconds  Total Procedure Duration: 0 hours 11 minutes 49 seconds  Findings:      Non-bleeding internal hemorrhoids were found.      A 5 mm polyp was found in the transverse colon. The polyp was sessile.       The polyp was removed with a cold snare. Resection and retrieval were       complete.      The exam was otherwise without abnormality. Impression:               - Non-bleeding internal hemorrhoids.                           - One 5 mm polyp in the transverse colon, removed                            with a cold snare. Resected and retrieved.                           - The examination was otherwise normal. Moderate Sedation:      Per Anesthesia Care Recommendation:           - Patient has a contact number available for                            emergencies. The signs and symptoms of potential                            delayed complications were discussed with the  patient. Return to normal activities tomorrow.                            Written discharge instructions were provided to the                            patient.                           - Resume previous diet.                           - Continue present medications.                           - Await pathology results.                           - No repeat colonoscopy due to age.                           - Return to GI clinic PRN. Procedure Code(s):        --- Professional ---                           [phone], Colonoscopy, flexible; with removal of                            tumor(s), polyp(s), or other lesion(s) by snare                             technique Diagnosis Code(s):        --- Professional ---                           Z86.010, Personal history of colonic polyps                           D12.3, Benign neoplasm of transverse colon (hepatic                            flexure or splenic flexure)                           K64.8, Other hemorrhoids CPT copyright 2022 [employer]. All rights reserved. The codes documented in this report are preliminary and upon coder review may  be revised to meet current compliance requirements. Rolando Cliche. Mordechai April, DO Rolando Cliche. Mordechai April, DO 09/13/2023 1:38:27 PM This report has been signed electronically. Number of Addenda: 0

## 2023-09-14 ENCOUNTER — Encounter (HOSPITAL_COMMUNITY): Payer: Self-pay | Admitting: Internal Medicine

## 2023-09-14 LAB — SURGICAL PATHOLOGY

## 2023-09-14 NOTE — Anesthesia Postprocedure Evaluation (Signed)
 Anesthesia Post Note  Patient: ICOLE PLACHTA  Procedure(s) Performed: COLONOSCOPY  Patient location during evaluation: Phase II Anesthesia Type: General Level of consciousness: awake Pain management: pain level controlled Vital Signs Assessment: post-procedure vital signs reviewed and stable Respiratory status: spontaneous breathing and respiratory function stable Cardiovascular status: blood pressure returned to baseline and stable Postop Assessment: no headache and no apparent nausea or vomiting Anesthetic complications: no Comments: Late entry   No notable events documented.   Last Vitals:  Vitals:   09/13/23 1347 09/13/23 1348  BP: (!) 99/53 113/74  Pulse:    Resp:    Temp:    SpO2:      Last Pain:  Vitals:   09/13/23 1340  TempSrc: Oral  PainSc: 0-No pain                 Coretha Dew

## 2023-11-25 ENCOUNTER — Encounter (HOSPITAL_COMMUNITY): Payer: Self-pay

## 2023-11-25 ENCOUNTER — Ambulatory Visit (HOSPITAL_COMMUNITY)
Admission: RE | Admit: 2023-11-25 | Discharge: 2023-11-25 | Disposition: A | Discharge status: Home / Self Care | Source: Ambulatory Visit | Attending: Family Medicine | Admitting: Family Medicine

## 2023-11-25 DIAGNOSIS — Z1231 Encounter for screening mammogram for malignant neoplasm of breast: Secondary | ICD-10-CM | POA: Insufficient documentation

## 2023-11-26 ENCOUNTER — Other Ambulatory Visit (HOSPITAL_COMMUNITY): Payer: Self-pay | Admitting: Family Medicine

## 2023-11-26 DIAGNOSIS — M810 Age-related osteoporosis without current pathological fracture: Secondary | ICD-10-CM

## 2023-12-03 ENCOUNTER — Inpatient Hospital Stay (HOSPITAL_COMMUNITY): Admission: RE | Admit: 2023-12-03 | Source: Ambulatory Visit

## 2023-12-30 ENCOUNTER — Ambulatory Visit (HOSPITAL_COMMUNITY)
Admission: RE | Admit: 2023-12-30 | Discharge: 2023-12-30 | Disposition: A | Discharge status: Home / Self Care | Source: Ambulatory Visit | Attending: Family Medicine | Admitting: Family Medicine

## 2023-12-30 DIAGNOSIS — M810 Age-related osteoporosis without current pathological fracture: Secondary | ICD-10-CM | POA: Diagnosis present
